# Patient Record
Sex: Female | Born: 1975
Health system: Southern US, Community
[De-identification: ages and names within clinical notes are randomized; demographics above are authoritative.]

## PROBLEM LIST (undated history)

## (undated) DIAGNOSIS — T7840XA Allergy, unspecified, initial encounter: Secondary | ICD-10-CM

## (undated) DIAGNOSIS — Z6791 Unspecified blood type, Rh negative: Secondary | ICD-10-CM

## (undated) DIAGNOSIS — F419 Anxiety disorder, unspecified: Secondary | ICD-10-CM

## (undated) DIAGNOSIS — F32A Depression, unspecified: Secondary | ICD-10-CM

## (undated) DIAGNOSIS — F1910 Other psychoactive substance abuse, uncomplicated: Secondary | ICD-10-CM

## (undated) DIAGNOSIS — Z8619 Personal history of other infectious and parasitic diseases: Secondary | ICD-10-CM

## (undated) DIAGNOSIS — M1990 Unspecified osteoarthritis, unspecified site: Secondary | ICD-10-CM

## (undated) DIAGNOSIS — K311 Adult hypertrophic pyloric stenosis: Secondary | ICD-10-CM

## (undated) DIAGNOSIS — N739 Female pelvic inflammatory disease, unspecified: Secondary | ICD-10-CM

## (undated) HISTORY — DX: Depression, unspecified: F32.A

## (undated) HISTORY — PX: DILATION AND CURETTAGE OF UTERUS: SHX78

## (undated) HISTORY — DX: Anxiety disorder, unspecified: F41.9

## (undated) HISTORY — PX: OTHER SURGICAL HISTORY: SHX169

## (undated) HISTORY — DX: Unspecified blood type, rh negative: Z67.91

## (undated) HISTORY — DX: Allergy, unspecified, initial encounter: T78.40XA

## (undated) HISTORY — PX: HERNIA REPAIR: SHX51

## (undated) HISTORY — DX: Personal history of other infectious and parasitic diseases: Z86.19

## (undated) HISTORY — DX: Unspecified osteoarthritis, unspecified site: M19.90

## (undated) HISTORY — PX: RHINOPLASTY: SUR1284

## (undated) HISTORY — DX: Other psychoactive substance abuse, uncomplicated: F19.10

## (undated) HISTORY — PX: APPENDECTOMY: SHX54

## (undated) HISTORY — DX: Female pelvic inflammatory disease, unspecified: N73.9

---

## 2004-03-26 ENCOUNTER — Ambulatory Visit (HOSPITAL_COMMUNITY): Admission: RE | Admit: 2004-03-26 | Discharge: 2004-03-26 | Payer: Self-pay | Admitting: Family Medicine

## 2004-03-29 ENCOUNTER — Ambulatory Visit (HOSPITAL_COMMUNITY): Admission: RE | Admit: 2004-03-29 | Discharge: 2004-03-29 | Payer: Self-pay | Admitting: Family Medicine

## 2005-02-05 ENCOUNTER — Ambulatory Visit (HOSPITAL_COMMUNITY): Admission: RE | Admit: 2005-02-05 | Discharge: 2005-02-05 | Payer: Self-pay | Admitting: Family Medicine

## 2005-02-05 ENCOUNTER — Ambulatory Visit: Payer: Self-pay | Admitting: *Deleted

## 2005-02-19 ENCOUNTER — Ambulatory Visit: Payer: Self-pay | Admitting: *Deleted

## 2005-02-20 ENCOUNTER — Ambulatory Visit: Payer: Self-pay | Admitting: *Deleted

## 2005-02-20 ENCOUNTER — Inpatient Hospital Stay (HOSPITAL_COMMUNITY): Admission: AD | Admit: 2005-02-20 | Discharge: 2005-02-20 | Payer: Self-pay | Admitting: Obstetrics and Gynecology

## 2005-02-27 ENCOUNTER — Ambulatory Visit: Payer: Self-pay | Admitting: *Deleted

## 2005-03-13 ENCOUNTER — Ambulatory Visit (HOSPITAL_COMMUNITY): Admission: RE | Admit: 2005-03-13 | Discharge: 2005-03-13 | Payer: Self-pay | Admitting: Obstetrics and Gynecology

## 2005-03-13 ENCOUNTER — Ambulatory Visit: Payer: Self-pay | Admitting: Family Medicine

## 2005-03-20 ENCOUNTER — Ambulatory Visit: Payer: Self-pay | Admitting: *Deleted

## 2005-04-03 ENCOUNTER — Ambulatory Visit: Payer: Self-pay | Admitting: *Deleted

## 2005-04-09 ENCOUNTER — Ambulatory Visit: Payer: Self-pay | Admitting: *Deleted

## 2005-04-09 ENCOUNTER — Ambulatory Visit (HOSPITAL_COMMUNITY): Admission: RE | Admit: 2005-04-09 | Discharge: 2005-04-09 | Payer: Self-pay | Admitting: *Deleted

## 2005-04-17 ENCOUNTER — Ambulatory Visit: Payer: Self-pay | Admitting: *Deleted

## 2005-05-01 ENCOUNTER — Ambulatory Visit: Payer: Self-pay | Admitting: *Deleted

## 2005-05-15 ENCOUNTER — Ambulatory Visit: Payer: Self-pay | Admitting: *Deleted

## 2005-06-05 ENCOUNTER — Ambulatory Visit: Payer: Self-pay | Admitting: Family Medicine

## 2005-06-26 ENCOUNTER — Ambulatory Visit: Payer: Self-pay | Admitting: *Deleted

## 2005-07-03 ENCOUNTER — Ambulatory Visit: Payer: Self-pay | Admitting: *Deleted

## 2005-07-10 ENCOUNTER — Ambulatory Visit (HOSPITAL_COMMUNITY): Admission: RE | Admit: 2005-07-10 | Discharge: 2005-07-10 | Payer: Self-pay | Admitting: *Deleted

## 2005-07-10 ENCOUNTER — Ambulatory Visit: Payer: Self-pay | Admitting: Family Medicine

## 2005-07-25 ENCOUNTER — Ambulatory Visit: Payer: Self-pay | Admitting: *Deleted

## 2005-08-06 ENCOUNTER — Inpatient Hospital Stay (HOSPITAL_COMMUNITY): Admission: AD | Admit: 2005-08-06 | Discharge: 2005-08-06 | Payer: Self-pay | Admitting: *Deleted

## 2005-08-07 ENCOUNTER — Ambulatory Visit: Payer: Self-pay | Admitting: *Deleted

## 2005-08-14 ENCOUNTER — Ambulatory Visit: Payer: Self-pay | Admitting: *Deleted

## 2005-08-22 ENCOUNTER — Ambulatory Visit: Payer: Self-pay | Admitting: *Deleted

## 2005-08-28 ENCOUNTER — Ambulatory Visit: Payer: Self-pay | Admitting: *Deleted

## 2005-09-05 ENCOUNTER — Ambulatory Visit: Payer: Self-pay | Admitting: *Deleted

## 2010-11-11 ENCOUNTER — Encounter: Payer: Self-pay | Admitting: *Deleted

## 2011-10-24 ENCOUNTER — Telehealth: Payer: Self-pay | Admitting: *Deleted

## 2011-10-24 NOTE — Telephone Encounter (Signed)
phone has been disconnected home number and work number

## 2012-05-29 ENCOUNTER — Telehealth: Payer: Self-pay | Admitting: Obstetrics and Gynecology

## 2012-06-26 ENCOUNTER — Ambulatory Visit (INDEPENDENT_AMBULATORY_CARE_PROVIDER_SITE_OTHER): Payer: BLUE CROSS/BLUE SHIELD

## 2012-06-26 ENCOUNTER — Other Ambulatory Visit: Payer: Self-pay | Admitting: Obstetrics and Gynecology

## 2012-06-26 ENCOUNTER — Ambulatory Visit (INDEPENDENT_AMBULATORY_CARE_PROVIDER_SITE_OTHER): Payer: BLUE CROSS/BLUE SHIELD | Admitting: Obstetrics and Gynecology

## 2012-06-26 ENCOUNTER — Encounter: Payer: Self-pay | Admitting: Obstetrics and Gynecology

## 2012-06-26 VITALS — BP 122/66 | Temp 98.7°F | Ht 64.0 in | Wt 173.0 lb

## 2012-06-26 DIAGNOSIS — Z6791 Unspecified blood type, Rh negative: Secondary | ICD-10-CM | POA: Insufficient documentation

## 2012-06-26 DIAGNOSIS — Z202 Contact with and (suspected) exposure to infections with a predominantly sexual mode of transmission: Secondary | ICD-10-CM

## 2012-06-26 DIAGNOSIS — T8389XA Other specified complication of genitourinary prosthetic devices, implants and grafts, initial encounter: Secondary | ICD-10-CM

## 2012-06-26 DIAGNOSIS — T8332XA Displacement of intrauterine contraceptive device, initial encounter: Secondary | ICD-10-CM

## 2012-06-26 DIAGNOSIS — N926 Irregular menstruation, unspecified: Secondary | ICD-10-CM

## 2012-06-26 DIAGNOSIS — N739 Female pelvic inflammatory disease, unspecified: Secondary | ICD-10-CM | POA: Insufficient documentation

## 2012-06-26 DIAGNOSIS — Z124 Encounter for screening for malignant neoplasm of cervix: Secondary | ICD-10-CM

## 2012-06-26 DIAGNOSIS — Z9189 Other specified personal risk factors, not elsewhere classified: Secondary | ICD-10-CM

## 2012-06-26 DIAGNOSIS — Z8619 Personal history of other infectious and parasitic diseases: Secondary | ICD-10-CM | POA: Insufficient documentation

## 2012-06-26 MED ORDER — IBUPROFEN 800 MG PO TABS: 800.0000 mg | ORAL_TABLET | Freq: Three times a day (TID) | ORAL | Status: AC | PRN

## 2012-06-26 MED ORDER — DIAZEPAM 10 MG PO TABS
10.0000 mg | ORAL_TABLET | Freq: Four times a day (QID) | ORAL | Status: AC | PRN
Start: 2012-06-26 — End: 2012-07-06

## 2012-06-26 MED ORDER — HYDROCODONE-ACETAMINOPHEN 5-500 MG PO TABS: 1.0000 | ORAL_TABLET | Freq: Four times a day (QID) | ORAL | Status: AC | PRN

## 2012-06-26 MED ORDER — PROMETHAZINE HCL 25 MG PO TABS: 25.0000 mg | ORAL_TABLET | Freq: Four times a day (QID) | ORAL | Status: DC | PRN

## 2012-06-26 NOTE — Progress Notes (Signed)
  The patient reports irregular bleeding and brownish d/c with slight odor..  Contraception:IUD Mirena 11/2006  Last mammogram: patient does not recall results of last mammogram in 2008. Last pap: patient does not recall results of last pap in 2008.  GC/Chlamydia cultures offered: requested HIV/RPR/HbsAg offered:  requested HSV 1 and 2 glycoprotein offered: requested  Menstrual cycle regular and monthly: No: pt has Mirena that should have removed in February of this year. Menstrual flow normal: No: scant spotting since August.  Urinary symptoms: none Normal bowel movements: Yes Reports abuse at home: No  Subjective:    Jacqueline Jimenez is a 36 y.o. female, [id number], who presents for an annual exam. Mirena 11/2006    History   Social History  . Marital Status: Married    Spouse Name: N/A    Number of Children: N/A  . Years of Education: N/A   Social History Main Topics  . Smoking status: Never Smoker   . Smokeless tobacco: Never Used  . Alcohol Use: Yes  . Drug Use: No  . Sexually Active: Yes    Birth Control/ Protection: IUD     mirena   Other Topics Concern  . None   Social History Narrative  . None    Menstrual cycle:   LMP: Patient's last menstrual period was 06/05/2012.           Cycle: monthly, 4 days, very light, no dysmenorrhea. Spotting for last 2 weeks de novo  The following portions of the patient's history were reviewed and updated as appropriate: allergies, current medications, past family history, past medical history, past social history, past surgical history and problem list.  Review of Systems Pertinent items are noted in HPI. Breast:Negative for breast lump,nipple discharge or nipple retraction Gastrointestinal: Negative for abdominal pain, change in bowel habits or rectal bleeding Urinary:negative   Objective:    BP 122/66  Temp 98.7 F (37.1 C) (Oral)  Ht 5\' 4"  (1.626 m)  Wt 173 lb (78.472 kg)  BMI 29.70 kg/m2  LMP 06/05/2012    Weight:    Wt Readings from Last 1 Encounters:  06/26/12 173 lb (78.472 kg)          BMI: Body mass index is 29.70 kg/(m^2).  General Appearance: Alert, appropriate appearance for age. No acute distress HEENT: Grossly normal Neck / Thyroid: Supple, no masses, nodes or enlargement Lungs: clear to auscultation bilaterally Back: No CVA tenderness Breast Exam: No masses or nodes.No dimpling, nipple retraction or discharge. Cardiovascular: Regular rate and rhythm. S1, S2, no murmur Gastrointestinal: Soft, non-tender, no masses or organomegaly Pelvic Exam: Vulva and vagina appear normal. Bimanual exam reveals normal uterus and adnexa. No strings. Rectovaginal: not indicated Lymphatic Exam: Non-palpable nodes in neck, clavicular, axillary, or inguinal regions Skin: no rash or abnormalities Neurologic: Normal gait and speech, no tremor  Psychiatric: Alert and oriented, appropriate affect.   Ultrasound: normal anatomy with IUD well placed  UPT: Negative   Assessment:    Normal gyn exam Contraceptive management : ultrasound today U/S shows IUD in place. Strong family h/o breast cancer   Plan:    mammogram pap smear return annually or prn Offer HSC/removal of IUD/insertion of new IUD: procedure, R&B reviewed  STD screening: declined Contraception:IUD Genetic counseling for breast cancer      Shareena Nusz AMD

## 2012-06-30 ENCOUNTER — Telehealth: Payer: Self-pay

## 2012-06-30 ENCOUNTER — Telehealth: Payer: Self-pay | Admitting: Obstetrics and Gynecology

## 2012-06-30 LAB — PAP IG, CT-NG, RFX HPV ASCU
Chlamydia Probe Amp: NEGATIVE
GC Probe Amp: NEGATIVE

## 2012-06-30 NOTE — Telephone Encounter (Signed)
Valium and Vicodin called in to pharmacy for procedure.  ld

## 2012-06-30 NOTE — Telephone Encounter (Signed)
Attempted to call pt to schedule genetic counseling. Home phone number rang as fax and then busy x3. Work number unable to accept calls.

## 2012-06-30 NOTE — Telephone Encounter (Signed)
Message copied by Mason Jim on Tue Jun 30, 2012  3:26 PM ------      Message from: Silverio Lay      Created: Fri Jun 26, 2012  2:55 PM      Regarding: Genetic counseling       Please call pt to schedule genetic counseling

## 2012-07-03 ENCOUNTER — Telehealth: Payer: Self-pay | Admitting: Obstetrics and Gynecology

## 2012-07-03 NOTE — Telephone Encounter (Signed)
Tc to pt. LM with female to return call.

## 2012-07-06 ENCOUNTER — Telehealth: Payer: Self-pay

## 2012-07-06 NOTE — Telephone Encounter (Signed)
LM with female to call office.  ld

## 2012-07-06 NOTE — Telephone Encounter (Signed)
LM with female to return call.  ld

## 2012-07-06 NOTE — Telephone Encounter (Signed)
Message copied by Larwance Rote on Mon Jul 06, 2012 12:00 PM ------      Message from: Silverio Lay      Created: Thu Jul 02, 2012 10:25 PM      Regarding: labs were not collected       Please call to collect or cancel if pt changed her mind

## 2012-07-06 NOTE — Telephone Encounter (Signed)
Message copied by Larwance Rote on Mon Jul 06, 2012 11:43 AM ------      Message from: Silverio Lay      Created: Thu Jul 02, 2012 10:25 PM      Regarding: labs were not collected       Please call to collect or cancel if pt changed her mind

## 2012-07-08 ENCOUNTER — Other Ambulatory Visit: Payer: Self-pay

## 2012-07-08 ENCOUNTER — Encounter: Payer: Self-pay | Admitting: Obstetrics and Gynecology

## 2012-07-09 ENCOUNTER — Telehealth: Payer: Self-pay | Admitting: Obstetrics and Gynecology

## 2012-07-09 NOTE — Telephone Encounter (Signed)
HSC; Removal of IUD and Insertion of Mirena scheduled for 07/16/12 @ 11:00 with SR.  [employer] effective 10/22/11.  Plan pays 100% after a $15 copay.  Per Shereece A ref # [id number]. Jacqueline Jimenez

## 2012-07-15 ENCOUNTER — Telehealth: Payer: Self-pay | Admitting: Obstetrics and Gynecology

## 2012-07-15 NOTE — Telephone Encounter (Signed)
HSC; Removal of IUD and Insertion of Mirena rescheduled to 08/07/12 @ 9:30 with SR.  [employer] effective 10/22/11.  Plan pays 100% after a $15 copay.  Per Shereece A ref # [id number]. Merita Norton Pridgen

## 2012-07-16 ENCOUNTER — Encounter: Payer: BC Managed Care – PPO | Admitting: Obstetrics and Gynecology

## 2012-07-17 ENCOUNTER — Telehealth: Payer: Self-pay | Admitting: Obstetrics and Gynecology

## 2012-07-17 NOTE — Telephone Encounter (Signed)
HSC; Removal of IUD and Insertion of Mirena rescheduled to 08/04/12 @ 11:20 with SR.  Patient instructed to arrive @ 10:00.  [employer] effective 10/22/11.  Plan pays 100% after a $15 copay. . Per Shereece A ref # [id number]. -ap

## 2012-07-17 NOTE — Telephone Encounter (Signed)
HSC; Removal of IUD and Insertion of Mirena rescheduled to 08/04/12 @ 11:00 with SR.  Patient instructed to arrive @ 10:00.  [employer] effective 10/22/11.  Plan pays 100% after a $15 copay. . Per Shereece A ref # [id number]. Merita Norton Pridgen

## 2012-07-22 NOTE — Telephone Encounter (Signed)
TC to pt. Scheduled for genetic counseling 08/14/12.  Pretest letter and info sent.

## 2012-07-24 ENCOUNTER — Encounter: Payer: BC Managed Care – PPO | Admitting: Obstetrics and Gynecology

## 2012-08-04 ENCOUNTER — Ambulatory Visit (INDEPENDENT_AMBULATORY_CARE_PROVIDER_SITE_OTHER): Payer: BLUE CROSS/BLUE SHIELD | Admitting: Obstetrics and Gynecology

## 2012-08-04 ENCOUNTER — Ambulatory Visit: Payer: BC Managed Care – PPO

## 2012-08-04 VITALS — BP 134/82 | HR 84 | Temp 98.6°F | Resp 12 | Wt 174.0 lb

## 2012-08-04 DIAGNOSIS — N938 Other specified abnormal uterine and vaginal bleeding: Secondary | ICD-10-CM

## 2012-08-04 DIAGNOSIS — Z3009 Encounter for other general counseling and advice on contraception: Secondary | ICD-10-CM

## 2012-08-04 DIAGNOSIS — N949 Unspecified condition associated with female genital organs and menstrual cycle: Secondary | ICD-10-CM

## 2012-08-04 DIAGNOSIS — T8332XA Displacement of intrauterine contraceptive device, initial encounter: Secondary | ICD-10-CM

## 2012-08-04 DIAGNOSIS — T8339XA Other mechanical complication of intrauterine contraceptive device, initial encounter: Secondary | ICD-10-CM

## 2012-08-04 MED ORDER — KETOROLAC TROMETHAMINE 60 MG/2ML IM SOLN
60.0000 mg | Freq: Once | INTRAMUSCULAR | Status: AC
  Administered 2012-08-04: 60 mg via INTRAMUSCULAR

## 2012-08-04 MED ORDER — LEVONORGESTREL 20 MCG/24HR IU IUD
INTRAUTERINE_SYSTEM | Freq: Once | INTRAUTERINE | Status: AC
  Administered 2012-08-04: 1 via INTRAUTERINE

## 2012-08-04 NOTE — Progress Notes (Signed)
DIAGNOSTIC AND/OR OPERATIVE IN-OFFICE HYSTEROSCOPY PROCEDURE    Patient's Name: Jacqueline Jimenez  Date of Birth: 1975-11-06  Date of Procedure: 08/04/2012     Preoperative Diagnosis: DUB with expired stringless IUD  Postoperative Diagnosis: same  Name of Operation/Procedure: Hysteroscopy with removal of IUD and insertion of new IUD  Estimated Hysteroscopic Fluid Deficit: 50  EBL: Minimal   ZOX:WRUEAV prior to procedure   Procedure: The patient was placed in the dorsal lithotomy position.  She was sterilely prepped and draped.  A Grave's speculum was placed in the vaginal vault.  The anterior lip of the cervix was grasped with a single tooth tenaculum.  Using Lidocaine 1% plain; 10mL and Marcaine 0.25% plain; 10mL, a paracervical block was placed in the sacrouterine pillars as we near the right and left parametrial areas.  Approximately 20 ml was used.  A uterine dilator was then used to dilate the internal cervical os.  The uterus was sounded to 8 cm.  The Slimline Hysteroscope was introduced with NS as the distention media.  The findings were normal and thin endometrium with 2 normal tubal ostia.  Sharp curettage was not performed. The IUD was grasped with hysteroscopic forceps and easily removed. The device was retracted and safely removed from the cavity. A new Mirena was easily inserted per protocol.  The instruments were removed.  Sponge and needle counts were correct at the completion of the procedure.  She tolerated the procedure well and was discharged home without complication.  [name] A   Vitals @ 1:40 pm 118/60 76 Pulse 16 Resp  Vitals @ 1:55 pm 188/80 80 Pulse 16 Resp

## 2012-08-07 ENCOUNTER — Encounter: Payer: BC Managed Care – PPO | Admitting: Obstetrics and Gynecology

## 2012-08-14 ENCOUNTER — Ambulatory Visit (INDEPENDENT_AMBULATORY_CARE_PROVIDER_SITE_OTHER): Payer: BLUE CROSS/BLUE SHIELD | Admitting: Obstetrics and Gynecology

## 2012-08-14 ENCOUNTER — Other Ambulatory Visit: Payer: Self-pay | Admitting: Obstetrics and Gynecology

## 2012-08-14 DIAGNOSIS — Z803 Family history of malignant neoplasm of breast: Secondary | ICD-10-CM

## 2012-08-14 NOTE — Progress Notes (Signed)
Genetic counseling for BRCA 1 and 2 completed. Questions answered.  Pt desires testing. Specimen obtained and sent via [employer].  F/U appt scheduled with Dr SR or nurse 09/10/2012.

## 2012-08-19 ENCOUNTER — Other Ambulatory Visit: Payer: Self-pay

## 2012-08-19 DIAGNOSIS — Z803 Family history of malignant neoplasm of breast: Secondary | ICD-10-CM

## 2012-08-21 ENCOUNTER — Encounter: Payer: BC Managed Care – PPO | Admitting: Obstetrics and Gynecology

## 2012-08-26 ENCOUNTER — Encounter: Payer: Self-pay | Admitting: Obstetrics and Gynecology

## 2012-08-26 ENCOUNTER — Ambulatory Visit (INDEPENDENT_AMBULATORY_CARE_PROVIDER_SITE_OTHER): Payer: BLUE CROSS/BLUE SHIELD | Admitting: Obstetrics and Gynecology

## 2012-08-26 VITALS — BP 116/80 | Ht 64.8 in | Wt 170.0 lb

## 2012-08-26 DIAGNOSIS — Z9189 Other specified personal risk factors, not elsewhere classified: Secondary | ICD-10-CM

## 2012-08-26 DIAGNOSIS — Z202 Contact with and (suspected) exposure to infections with a predominantly sexual mode of transmission: Secondary | ICD-10-CM

## 2012-08-26 DIAGNOSIS — Z30431 Encounter for routine checking of intrauterine contraceptive device: Secondary | ICD-10-CM

## 2012-08-26 LAB — HEPATITIS C ANTIBODY: HCV Ab: NEGATIVE

## 2012-08-26 NOTE — Progress Notes (Signed)
Surgery: Hysteroscopy with removal of IUD and insertion of new IUD       Date: 08/04/2012  Eating a regular diet without difficulty. Bowel movements are normal.  The patient is not having any pain.  Bladder function is returned to normal. Vaginal bleeding: spotting Vaginal discharge: no vaginal discharge    Subjective:     Chemeka Filice Mccartin is a 36 y.o. female who presents for post-op visit.   The following portions of the patient's history were reviewed and updated as appropriate: allergies, current medications, past family history, past medical history, past social history, past surgical history and problem list.  Review of Systems Pertinent items are noted in HPI.   Objective:    There were no vitals taken for this visit. Weight:  Wt Readings from Last 1 Encounters:  08/04/12 174 lb (78.926 kg)    BMI: There is no height or weight on file to calculate BMI.  General Appearance: Alert, appropriate appearance for age. No acute distress Pelvic Exam: cervix normal, strings visualized, pelvic exam normal  Assessment:    Doing well postoperatively. Operative findings again reviewed.    Plan:   STD screen per pt Mammogram yearly due to strong family history BRCA testing this month  Silverio Lay MD 11/6/20139:03 AM

## 2012-08-27 LAB — HSV 2 ANTIBODY, IGG: HSV 2 Glycoprotein G Ab, IgG: <0.1 IV

## 2012-08-27 LAB — HSV 1 ANTIBODY, IGG: HSV 1 Glycoprotein G Ab, IgG: <0.1 IV

## 2012-09-04 ENCOUNTER — Telehealth: Payer: Self-pay | Admitting: Obstetrics and Gynecology

## 2012-09-04 NOTE — Telephone Encounter (Signed)
Tc to pt. States has already scheduled mammogram. Reminded of appt 09/10/12 to discuss genetic counseling results.

## 2012-09-07 ENCOUNTER — Other Ambulatory Visit: Payer: Self-pay

## 2012-09-07 DIAGNOSIS — Z1231 Encounter for screening mammogram for malignant neoplasm of breast: Secondary | ICD-10-CM

## 2012-09-07 DIAGNOSIS — Z139 Encounter for screening, unspecified: Secondary | ICD-10-CM

## 2012-09-10 ENCOUNTER — Encounter: Payer: BC Managed Care – PPO | Admitting: Obstetrics and Gynecology

## 2012-09-10 ENCOUNTER — Ambulatory Visit (INDEPENDENT_AMBULATORY_CARE_PROVIDER_SITE_OTHER): Payer: BC Managed Care – PPO | Admitting: Obstetrics and Gynecology

## 2012-09-10 DIAGNOSIS — Z803 Family history of malignant neoplasm of breast: Secondary | ICD-10-CM

## 2012-09-10 NOTE — Progress Notes (Signed)
Pt presented for results of BRCA 1 and BRCA 2.  Informed result negative. Information reviewed regarding increased risk of breast cancer due to family history. Per Dr SR, recommended breast MRI q 2 years.  Pt is agreeable.  Pt is scheduled for mammogram 09/11/12 but will need to R/S. MRI to be scheduled after that mammogram.

## 2012-09-14 ENCOUNTER — Other Ambulatory Visit: Payer: Self-pay | Admitting: Obstetrics and Gynecology

## 2012-09-14 DIAGNOSIS — Z803 Family history of malignant neoplasm of breast: Secondary | ICD-10-CM

## 2012-09-24 ENCOUNTER — Telehealth: Payer: Self-pay | Admitting: Obstetrics and Gynecology

## 2012-09-24 NOTE — Telephone Encounter (Signed)
TC to pt. Per Renee Harder informed cannot get prior auth for breast MRI until mammogram is completed. Pt states she will schedule and call when completed.

## 2012-10-15 ENCOUNTER — Telehealth: Payer: Self-pay | Admitting: Obstetrics and Gynecology

## 2012-10-15 NOTE — Telephone Encounter (Signed)
TC to pt. States mammogram is scheduled Jan 2014. Advised pt to inform me when completed so insurance person can request prior auth for MRI. States received letter from insurance i that MR

## 2012-11-05 ENCOUNTER — Ambulatory Visit: Payer: BC Managed Care – PPO

## 2012-11-09 ENCOUNTER — Ambulatory Visit
Admission: RE | Admit: 2012-11-09 | Discharge: 2012-11-09 | Disposition: A | Discharge status: Home / Self Care | Payer: BC Managed Care – PPO | Source: Ambulatory Visit | Attending: Obstetrics and Gynecology | Admitting: Obstetrics and Gynecology

## 2012-11-09 DIAGNOSIS — Z1231 Encounter for screening mammogram for malignant neoplasm of breast: Secondary | ICD-10-CM

## 2012-11-11 ENCOUNTER — Other Ambulatory Visit: Payer: Self-pay | Admitting: Obstetrics and Gynecology

## 2012-11-11 DIAGNOSIS — R928 Other abnormal and inconclusive findings on diagnostic imaging of breast: Secondary | ICD-10-CM

## 2012-11-12 NOTE — Progress Notes (Signed)
Quick Note:  The recent mammogram is incomplete / abnormal suggesting a close follow-up / additional images. When is the next appointment to follow-up on this mammogram?  Please document in chart. ______ 

## 2012-11-24 ENCOUNTER — Other Ambulatory Visit: Payer: BC Managed Care – PPO

## 2012-12-08 ENCOUNTER — Ambulatory Visit
Admission: RE | Admit: 2012-12-08 | Discharge: 2012-12-08 | Disposition: A | Discharge status: Home / Self Care | Payer: BC Managed Care – PPO | Source: Ambulatory Visit | Attending: Obstetrics and Gynecology | Admitting: Obstetrics and Gynecology

## 2012-12-08 DIAGNOSIS — R928 Other abnormal and inconclusive findings on diagnostic imaging of breast: Secondary | ICD-10-CM

## 2012-12-10 NOTE — Progress Notes (Signed)
Quick Note:  The recent mammogram is incomplete / abnormal suggesting a close follow-up / additional images. When is the next appointment to follow-up on this mammogram?  Please document in chart. ______ 

## 2012-12-11 ENCOUNTER — Other Ambulatory Visit: Payer: Self-pay | Admitting: Obstetrics and Gynecology

## 2012-12-11 DIAGNOSIS — N6489 Other specified disorders of breast: Secondary | ICD-10-CM

## 2014-08-22 ENCOUNTER — Encounter: Payer: Self-pay | Admitting: Obstetrics and Gynecology

## 2015-04-24 ENCOUNTER — Ambulatory Visit (INDEPENDENT_AMBULATORY_CARE_PROVIDER_SITE_OTHER): Payer: BLUE CROSS/BLUE SHIELD | Admitting: Family Medicine

## 2015-04-24 VITALS — BP 120/88 | HR 82 | Temp 99.1°F | Resp 18 | Ht 65.0 in | Wt 157.2 lb

## 2015-04-24 DIAGNOSIS — T798XXA Other early complications of trauma, initial encounter: Secondary | ICD-10-CM

## 2015-04-24 DIAGNOSIS — R350 Frequency of micturition: Secondary | ICD-10-CM

## 2015-04-24 LAB — POCT UA - MICROSCOPIC ONLY
CASTS, UR, LPF, POC: NEGATIVE
Crystals, Ur, HPF, POC: NEGATIVE
Yeast, UA: NEGATIVE

## 2015-04-24 LAB — POCT URINALYSIS DIPSTICK
Glucose, UA: NEGATIVE
Nitrite, UA: POSITIVE
Protein, UA: 100
Spec Grav, UA: >=1.03
Urobilinogen, UA: 1
pH, UA: 6

## 2015-04-24 MED ORDER — DOXYCYCLINE HYCLATE 100 MG PO CAPS: 100.0000 mg | ORAL_CAPSULE | Freq: Two times a day (BID) | ORAL | Status: DC

## 2015-04-24 MED ORDER — PHENAZOPYRIDINE HCL 200 MG PO TABS: 200.0000 mg | ORAL_TABLET | Freq: Three times a day (TID) | ORAL | Status: DC | PRN

## 2015-04-24 NOTE — Patient Instructions (Signed)

## 2015-04-24 NOTE — Progress Notes (Signed)
Subjective:  This chart was scribed for Reginia Forts, MD by Thea Alken, ED Scribe. This patient was seen in room 5 and the patient's care was started at 4:20 PM.   Patient ID: Jacqueline Jimenez, female    DOB: 05-07-1976, 39 y.o.   MRN: [id number]  HPI Chief Complaint  Patient presents with  . Urinary Tract Infection    urinary frequency x1 day  . Infected Belly Button Right  . pRESCRIPTIONS    wANTS ALL PRESCRIPTIONS PRINTED   HPI Comments: Jacqueline Jimenez is a 39 y.o. female who presents to the [facility] complaining of urinary urgency that began yesterday. Pt reports associated urinary frequency, straining with urination and bladder fullness. She's had 1 UTI in the past. Pt is married and denies new sexual partners. She uses mirena for birth control She denies fevers, chills, sweats, hematuria, vaginal discharge, nausea, emesis, and back pan. Pt is leaving for [place] in 6 days for vacation. She is followed by [facility].  Pt also complains of a possible naval piercing infection. Pt states she's had her naval pierced for about 20 years. She lost a piece of her ring and after having it replaced she began to have some discomfort and irritation. Pt has been cleaning area with peroxide and abx ointment.  Past Medical History  Diagnosis Date  . PID (pelvic inflammatory disease)   . Blood type, Rh negative   . History of chicken pox    Past Surgical History  Procedure Laterality Date  . Cesarean section    . Dilation and curettage of uterus    . Rhinoplasty    . Calf augmentation    . Club foot correction    . Appendectomy     Prior to Admission medications   Medication Sig Start Date End Date Taking? Authorizing Provider  amphetamine-dextroamphetamine (ADDERALL) 20 MG tablet Take 20 mg by mouth daily.   Yes Historical Provider, MD  ergocalciferol (DRISDOL) 50000 UNITS capsule Take 50,000 Units by mouth once a week.   Yes Historical Provider, MD    fluticasone (FLONASE) 50 MCG/ACT nasal spray Place 1 spray into both nostrils daily.   Yes Historical Provider, MD  levocetirizine (XYZAL) 5 MG tablet Take 5 mg by mouth every evening.   Yes Historical Provider, MD  montelukast (SINGULAIR) 10 MG tablet Take 10 mg by mouth at bedtime.   Yes Historical Provider, MD  promethazine (PHENERGAN) 25 MG tablet Take 1 tablet (25 mg total) by mouth every 6 (six) hours as needed for nausea. 06/26/12 07/03/12  Delsa Bern, MD   History   Social History  . Marital Status: Married    Spouse Name: N/A  . Number of Children: N/A  . Years of Education: N/A   Occupational History  . Not on file.   Social History Main Topics  . Smoking status: Never Smoker   . Smokeless tobacco: Never Used  . Alcohol Use: Yes  . Drug Use: No  . Sexual Activity: Yes    Birth Control/ Protection: IUD     Comment: mirena   Other Topics Concern  . Not on file   Social History Narrative    Review of Systems  Constitutional: Negative for fever, chills, diaphoresis and fatigue.  Gastrointestinal: Negative for nausea, vomiting and diarrhea.  Genitourinary: Positive for urgency and frequency. Negative for dysuria, hematuria, flank pain, vaginal bleeding, vaginal discharge, genital sores, vaginal pain and pelvic pain.  Musculoskeletal: Negative for myalgias and  back pain.   Objective:   Physical Exam  Constitutional: She is oriented to person, place, and time. She appears well-developed and well-nourished. No distress.  HENT:  Head: Normocephalic and atraumatic.  Eyes: Conjunctivae and EOM are normal.  Neck: Neck supple.  Cardiovascular: Normal rate, regular rhythm and normal heart sounds.   No murmur heard. Pulmonary/Chest: Effort normal and breath sounds normal. No respiratory distress. She has no wheezes. She has no rales.  Abdominal: Soft. Bowel sounds are normal. She exhibits no distension and no mass. There is no tenderness. There is no rebound, no guarding  and no CVA tenderness.  Musculoskeletal: Normal range of motion.  Neurological: She is alert and oriented to person, place, and time.  Skin: Skin is warm and dry. There is erythema.  Piercing at umbilicus with mild erythema and induration; no active drainage; mildly TTP.  Psychiatric: She has a normal mood and affect. Her behavior is normal.  Nursing note and vitals reviewed.   Results for orders placed or performed in visit on 04/24/15  POCT UA - Microscopic Only  Result Value Ref Range   WBC, Ur, HPF, POC 35-40    RBC, urine, microscopic 5-10    Bacteria, U Microscopic 3+    Mucus, UA 1+    Epithelial cells, urine per micros 2-5    Crystals, Ur, HPF, POC neg    Casts, Ur, LPF, POC neg    Yeast, UA neg   POCT urinalysis dipstick  Result Value Ref Range   Color, UA amber    Clarity, UA cloudy    Glucose, UA neg    Bilirubin, UA small    Ketones, UA trace    Spec Grav, UA >=1.030    Blood, UA large    pH, UA 6.0    Protein, UA 100    Urobilinogen, UA 1.0    Nitrite, UA pos    Leukocytes, UA moderate (2+) (A) Negative   Assessment & Plan:   1. Urinary frequency   2. Infected piercing of trunk, initial encounter    1. Urinary frequency: New. Send urine culture; treat empirically with Doxycycline and Pyridium. 2.  Infected piercing of trunk: New. Mild. Doxycycline.  Continue with local wound care.   Meds ordered this encounter  Medications  . amphetamine-dextroamphetamine (ADDERALL) 20 MG tablet    Sig: Take 20 mg by mouth daily.  . fluticasone (FLONASE) 50 MCG/ACT nasal spray    Sig: Place 1 spray into both nostrils daily.  . montelukast (SINGULAIR) 10 MG tablet    Sig: Take 10 mg by mouth at bedtime.  Marland Kitchen levocetirizine (XYZAL) 5 MG tablet    Sig: Take 5 mg by mouth every evening.  Marland Kitchen DISCONTD: doxycycline (VIBRAMYCIN) 100 MG capsule    Sig: Take 1 capsule (100 mg total) by mouth 2 (two) times daily.    Dispense:  20 capsule    Refill:  0  . doxycycline  (VIBRAMYCIN) 100 MG capsule    Sig: Take 1 capsule (100 mg total) by mouth 2 (two) times daily.    Dispense:  20 capsule    Refill:  0  . phenazopyridine (PYRIDIUM) 200 MG tablet    Sig: Take 1 tablet (200 mg total) by mouth 3 (three) times daily as needed for pain.    Dispense:  6 tablet    Refill:  0   I personally performed the services described in this documentation, which was scribed in my presence. The recorded information has been reviewed  and considered.  Samuell Knoble Elayne Guerin, M.D. [facility] [address] [place], [place]  [address] [phone] phone [phone] fax

## 2015-04-25 ENCOUNTER — Telehealth: Payer: Self-pay

## 2015-04-25 ENCOUNTER — Other Ambulatory Visit: Payer: Self-pay | Admitting: Physician Assistant

## 2015-04-25 DIAGNOSIS — R110 Nausea: Secondary | ICD-10-CM

## 2015-04-25 MED ORDER — ONDANSETRON 8 MG PO TBDP: 8.0000 mg | ORAL_TABLET | Freq: Three times a day (TID) | ORAL | Status: DC | PRN

## 2015-04-25 NOTE — Progress Notes (Signed)
Patient concerned of nausea with taking the doxycycline.  She states that it is following the doxycycline only and not the pyridium.  It occurs 45 minutes after administration of doxycycline only.   She is concerned due to her emetophobia.  She has eaten very lwhen she takes the medication.  She asked to take a half dose.   Plan: Continue the doxycycline with the addition of zofran for nausea.  I have advised her to eat a normal meal prior to taking the medication, and with water--avoiding dairy.   She will contact if her nausea continues. May consider changing to bactrim.

## 2015-04-25 NOTE — Telephone Encounter (Addendum)
Pt states she had a side effect from the medicine given yesterday and wanted to know if the Dr could give her a lower dosage Please call [facility]

## 2015-04-26 LAB — URINE CULTURE: Colony Count: >=100000

## 2015-04-26 NOTE — Telephone Encounter (Signed)
doxycycline (VIBRAMYCIN) 100 MG capsule 20 capsule 0 04/24/2015      Take 1 capsule (100 mg total) by mouth 2 (two) times daily. - Oral    phenazopyridine (PYRIDIUM) 200 MG tablet 6 tablet 0 04/24/2015     Take 1 tablet (200 mg total) by mouth 3 (three) times daily as needed for pain. - Oral      Called pt, unable o leave voicemail.

## 2015-04-27 NOTE — Telephone Encounter (Signed)
Spoke with pt, she was Rx'd Zofran for nausea.

## 2015-05-16 ENCOUNTER — Ambulatory Visit (INDEPENDENT_AMBULATORY_CARE_PROVIDER_SITE_OTHER): Payer: BLUE CROSS/BLUE SHIELD | Admitting: Internal Medicine

## 2015-05-16 VITALS — BP 120/78 | HR 91 | Temp 98.3°F | Resp 18 | Ht 64.0 in | Wt 154.0 lb

## 2015-05-16 DIAGNOSIS — R21 Rash and other nonspecific skin eruption: Secondary | ICD-10-CM | POA: Diagnosis not present

## 2015-05-16 DIAGNOSIS — J029 Acute pharyngitis, unspecified: Secondary | ICD-10-CM | POA: Diagnosis not present

## 2015-05-16 DIAGNOSIS — R059 Cough, unspecified: Secondary | ICD-10-CM

## 2015-05-16 DIAGNOSIS — R300 Dysuria: Secondary | ICD-10-CM | POA: Diagnosis not present

## 2015-05-16 DIAGNOSIS — R05 Cough: Secondary | ICD-10-CM | POA: Diagnosis not present

## 2015-05-16 LAB — POCT URINALYSIS DIPSTICK
Bilirubin, UA: NEGATIVE
Blood, UA: NEGATIVE
GLUCOSE UA: NEGATIVE
KETONES UA: NEGATIVE
LEUKOCYTES UA: NEGATIVE
Nitrite, UA: NEGATIVE
Protein, UA: NEGATIVE
UROBILINOGEN UA: 1
pH, UA: 7

## 2015-05-16 LAB — POCT UA - MICROSCOPIC ONLY
CASTS, UR, LPF, POC: NEGATIVE
CRYSTALS, UR, HPF, POC: NEGATIVE
YEAST UA: NEGATIVE

## 2015-05-16 MED ORDER — TRIAMCINOLONE ACETONIDE 0.1 % EX CREA: 1.0000 "application " | TOPICAL_CREAM | Freq: Two times a day (BID) | CUTANEOUS | Status: DC

## 2015-05-16 MED ORDER — CEPHALEXIN 250 MG/5ML PO SUSR: 500.0000 mg | Freq: Three times a day (TID) | ORAL | Status: DC

## 2015-05-16 NOTE — Progress Notes (Signed)
Subjective:  This chart was scribed for Tami Lin, MD by Leandra Kern, Medical Scribe. This patient was seen in Room 5 and the patient's care was started at 6:33 PM.   Patient ID: Jacqueline Jimenez, female    DOB: 1976/02/01, 39 y.o.   MRN: [id number]  Chief Complaint  Patient presents with  . Follow-up    UTI syptoms worsten  . Sore Throat    x 3days  . Cough    x 3 days  . body itch    x 3 days    HPI HPI Comments: Jacqueline Jimenez is a 39 y.o. female who presents to [facility] for a follow up on UTI symptoms.  Pt was seen here by Dr. Tamala Julian on 04/24/2015 for urinary frequency for which she was prescribed Doxycycline and Pyridium. Pt notes that she is still presented with urinary urgency and frequency. She reports that her symptoms are less severs. Pt expresses that she would like to get a medication that is in a liquid form because she has difficulty swallowing pills.   Pt reports that her skin itches in the sun exposed areas. She indicates that the symptoms started when she was in [place] more than a week ago where the weather is about a 100 degrees. Pt notes that she was outdoors frequently and using the doxycycline when the symptoms started. She did use sunscreen often. She states that the area is not itching currently, however her itching episodes do disturb her sleep.   Pt states that she has sore throat and a productive cough for about 3 days now. Pt notes that the symptoms do not keep her up at night. She states that the symptoms are worse in the morning time. She denies having a fever. Pt indicates that she occasionally uses Afrin for stuffy nose that she usually has in the morning.   Pt notes that she still present with erythema to the piercing site to her umbilicus. Pt has had a piercing for 20 years, and she was presented with the symptoms about a month and a half ago. Pt reports that she also experiences itching to the area. She states that the  Doxycycline did improve her symptoms. No longer go up he's of the piercing became embedded in it had to be removed and was replaced with a new piece to hold it in place. The inflammation may have started after this.    Patient Active Problem List   Diagnosis Date Noted  . PID (pelvic inflammatory disease)   . Blood type, Rh negative   . History of chicken pox    Past Medical History  Diagnosis Date  . PID (pelvic inflammatory disease)   . Blood type, Rh negative   . History of chicken pox    Past Surgical History  Procedure Laterality Date  . Cesarean section    . Dilation and curettage of uterus    . Rhinoplasty    . Calf augmentation    . Club foot correction    . Appendectomy     No Known Allergies Prior to Admission medications   Medication Sig Start Date End Date Taking? Authorizing Provider  amphetamine-dextroamphetamine (ADDERALL) 20 MG tablet Take 20 mg by mouth daily.   Yes Historical Provider, MD  fluticasone (FLONASE) 50 MCG/ACT nasal spray Place 1 spray into both nostrils daily.   Yes Historical Provider, MD  levocetirizine (XYZAL) 5 MG tablet Take 5 mg by mouth every evening.  Yes Historical Provider, MD  montelukast (SINGULAIR) 10 MG tablet Take 10 mg by mouth at bedtime.   Yes Historical Provider, MD  doxycycline (VIBRAMYCIN) 100 MG capsule Take 1 capsule (100 mg total) by mouth 2 (two) times daily. Patient not taking: Reported on 05/16/2015 04/24/15   Wardell Honour, MD  ergocalciferol (DRISDOL) 50000 UNITS capsule Take 50,000 Units by mouth once a week.    Historical Provider, MD  ondansetron (ZOFRAN-ODT) 8 MG disintegrating tablet Take 1 tablet (8 mg total) by mouth every 8 (eight) hours as needed for nausea. Patient not taking: Reported on 05/16/2015 04/25/15   Dorian Heckle English, PA  phenazopyridine (PYRIDIUM) 200 MG tablet Take 1 tablet (200 mg total) by mouth 3 (three) times daily as needed for pain. Patient not taking: Reported on 05/16/2015 04/24/15   Wardell Honour, MD  promethazine (PHENERGAN) 25 MG tablet Take 1 tablet (25 mg total) by mouth every 6 (six) hours as needed for nausea. 06/26/12 07/03/12  Delsa Bern, MD   History   Social History  . Marital Status: Married    Spouse Name: N/A  . Number of Children: N/A  . Years of Education: N/A   Occupational History  . Not on file.   Social History Main Topics  . Smoking status: Never Smoker   . Smokeless tobacco: Never Used  . Alcohol Use: Yes  . Drug Use: No  . Sexual Activity: Yes    Birth Control/ Protection: IUD     Comment: mirena   Other Topics Concern  . Not on file   Social History Narrative    Review of Systems  Constitutional: Negative for fever.  HENT: Positive for sore throat.   Respiratory: Positive for cough.   Genitourinary: Positive for urgency and frequency.  Skin: Positive for color change and wound.  Psychiatric/Behavioral: Positive for sleep disturbance.      Objective:   Physical Exam  Constitutional: She is oriented to person, place, and time. She appears well-developed and well-nourished. No distress.  HENT:  Head: Normocephalic and atraumatic.  Right Ear: External ear normal.  Left Ear: External ear normal.  Nose: Nose normal.  The pharynx is red with scant exudate  Eyes: Conjunctivae and EOM are normal. Pupils are equal, round, and reactive to light.  Neck: Neck supple.  Small cervical nodes not tender  Cardiovascular: Normal rate, regular rhythm and normal heart sounds.   Pulmonary/Chest: Effort normal and breath sounds normal. She has no wheezes. She has no rales.  Neurological: She is alert and oriented to person, place, and time. No cranial nerve deficit.  Skin: Skin is warm and dry.  She has diffuse hyperemia in all sun exposed areas with a T-shirt and shorts distribution. There are no petechiae, urticarial lesions, ecchymoses, vesicles, and no papular component.  Navel piercing continues to have erythema but no pus and no scar  formation and no granuloma  Psychiatric: She has a normal mood and affect. Her behavior is normal.  Nursing note and vitals reviewed.  BP 120/78 mmHg  Pulse 91  Temp(Src) 98.3 F (36.8 C) (Oral)  Resp 18  Ht 5\' 4"  (1.626 m)  Wt 154 lb (69.854 kg)  BMI 26.42 kg/m2  SpO2 99%  LMP 04/18/2015    Results for orders placed or performed in visit on 05/16/15  POCT UA - Microscopic Only  Result Value Ref Range   WBC, Ur, HPF, POC 1-5    RBC, urine, microscopic 0-3    Bacteria, U  Microscopic 1+    Mucus, UA Moderate    Epithelial cells, urine per micros 1-5    Crystals, Ur, HPF, POC negative    Casts, Ur, LPF, POC negative    Yeast, UA negative   POCT urinalysis dipstick  Result Value Ref Range   Color, UA yellow    Clarity, UA clear    Glucose, UA negative    Bilirubin, UA negative    Ketones, UA negative    Spec Grav, UA >=1.030    Blood, UA negative    pH, UA 7.0    Protein, UA negative    Urobilinogen, UA 1.0    Nitrite, UA negative    Leukocytes, UA Negative Negative    Assessment & Plan:  I have completed the patient encounter in its entirety as documented by the scribe, with editing by me where necessary. Aphrodite Harpenau P. Laney Pastor, M.D.  Dysuria -recent use 3 times a day with possibility of under treatment from prescribed medicine  Because of continued minor symptoms this will be covered with Keflex and will follow-up if she doesn't respond  Rash and nonspecific skin eruption--- this is a solar dermatitis/photosensitivity reaction secondary to doxycycline  To use moisturizer and this will resolve  Cough---Sore throat --very likely related to viral illness//will use throat culture since she'll be on Keflex anyway  Umbilicus piercing irritation--the piece it replaced maybe nickel///for now to use hydrogen peroxide plus soap and apply steroid cream   Meds ordered this encounter  Medications  . triamcinolone cream (KENALOG) 0.1 %    Sig: Apply 1 application topically 2  (two) times daily.    Dispense:  15 g    Refill:  0  . cephALEXin (KEFLEX) 250 MG/5ML suspension    Sig: Take 10 mLs (500 mg total) by mouth 3 (three) times daily.    Dispense:  300 mL    Refill:  0   doesn't swallow pills well

## 2015-05-18 LAB — CULTURE, GROUP A STREP

## 2015-05-19 ENCOUNTER — Telehealth: Payer: Self-pay

## 2015-05-19 NOTE — Telephone Encounter (Signed)
Patient is returning a missed phone call from lab. Please call! [phone]

## 2015-05-20 NOTE — Telephone Encounter (Signed)
Spoke with patient gave her results 

## 2015-06-09 ENCOUNTER — Other Ambulatory Visit: Payer: Self-pay

## 2015-06-09 MED ORDER — FLUCONAZOLE 150 MG PO TABS: 150.0000 mg | ORAL_TABLET | Freq: Once | ORAL | Status: DC

## 2015-06-10 ENCOUNTER — Other Ambulatory Visit: Payer: Self-pay | Admitting: Physician Assistant

## 2015-06-22 DIAGNOSIS — N3010 Interstitial cystitis (chronic) without hematuria: Secondary | ICD-10-CM | POA: Insufficient documentation

## 2015-07-08 ENCOUNTER — Emergency Department (HOSPITAL_COMMUNITY)
Admission: EM | Admit: 2015-07-08 | Discharge: 2015-07-08 | Disposition: A | Discharge status: Home / Self Care | Payer: BLUE CROSS/BLUE SHIELD | Source: Home / Self Care | Attending: Family Medicine | Admitting: Family Medicine

## 2015-07-08 ENCOUNTER — Encounter (HOSPITAL_COMMUNITY): Payer: Self-pay | Admitting: Emergency Medicine

## 2015-07-08 ENCOUNTER — Emergency Department (INDEPENDENT_AMBULATORY_CARE_PROVIDER_SITE_OTHER): Payer: BLUE CROSS/BLUE SHIELD

## 2015-07-08 DIAGNOSIS — B349 Viral infection, unspecified: Secondary | ICD-10-CM | POA: Diagnosis not present

## 2015-07-08 DIAGNOSIS — R6889 Other general symptoms and signs: Secondary | ICD-10-CM

## 2015-07-08 DIAGNOSIS — J069 Acute upper respiratory infection, unspecified: Secondary | ICD-10-CM | POA: Diagnosis not present

## 2015-07-08 MED ORDER — OSELTAMIVIR PHOSPHATE 75 MG PO CAPS: 75.0000 mg | ORAL_CAPSULE | Freq: Two times a day (BID) | ORAL | Status: DC

## 2015-07-08 NOTE — Discharge Instructions (Signed)
Influenza °Influenza ("the flu") is a viral infection of the respiratory tract. It occurs more often in winter months because people spend more time in close contact with one another. Influenza can make you feel very sick. Influenza easily spreads from person to person (contagious). °CAUSES  °Influenza is caused by a virus that infects the respiratory tract. You can catch the virus by breathing in droplets from an infected person's cough or sneeze. You can also catch the virus by touching something that was recently contaminated with the virus and then touching your mouth, nose, or eyes. °RISKS AND COMPLICATIONS °You may be at risk for a more severe case of influenza if you smoke cigarettes, have diabetes, have chronic heart disease (such as heart failure) or lung disease (such as asthma), or if you have a weakened immune system. Elderly people and pregnant women are also at risk for more serious infections. The most common problem of influenza is a lung infection (pneumonia). Sometimes, this problem can require emergency medical care and may be life threatening. °SIGNS AND SYMPTOMS  °Symptoms typically last 4 to 10 days and may include: °· Fever. °· Chills. °· Headache, body aches, and muscle aches. °· Sore throat. °· Chest discomfort and cough. °· Poor appetite. °· Weakness or feeling tired. °· Dizziness. °· Nausea or vomiting. °DIAGNOSIS  °Diagnosis of influenza is often made based on your history and a physical exam. A nose or throat swab test can be done to confirm the diagnosis. °TREATMENT  °In mild cases, influenza goes away on its own. Treatment is directed at relieving symptoms. For more severe cases, your health care provider may prescribe antiviral medicines to shorten the sickness. Antibiotic medicines are not effective because the infection is caused by a virus, not by bacteria. °HOME CARE INSTRUCTIONS °· Take medicines only as directed by your health care provider. °· Use a cool mist humidifier to make  breathing easier. °· Get plenty of rest until your temperature returns to normal. This usually takes 3 to 4 days. °· Drink enough fluid to keep your urine clear or pale yellow. °· Cover your mouth and nose when coughing or sneezing, and wash your hands well to prevent the virus from spreading. °· Stay home from work or school until the fever is gone for at least 1 full day. °PREVENTION  °An annual influenza vaccination (flu shot) is the best way to avoid getting influenza. An annual flu shot is now routinely recommended for all adults in the [place] °SEEK MEDICAL CARE IF: °· You experience chest pain, your cough worsens, or you produce more mucus. °· You have nausea, vomiting, or diarrhea. °· Your fever returns or gets worse. °SEEK IMMEDIATE MEDICAL CARE IF: °· You have trouble breathing, you become short of breath, or your skin or nails become bluish. °· You have severe pain or stiffness in the neck. °· You develop a sudden headache, or pain in the face or ear. °· You have nausea or vomiting that you cannot control. °MAKE SURE YOU:  °· Understand these instructions. °· Will watch your condition. °· Will get help right away if you are not doing well or get worse. °Document Released: 10/04/2000 Document Revised: 02/21/2014 Document Reviewed: 01/06/2012 °Exit[employer]® Patient Information ©2015 [employer]. This information is not intended to replace advice given to you by your health care provider. Make sure you discuss any questions you have with your health care provider. ° °Viral Infections °A viral infection can be caused by different types of viruses. Most viral infections are not serious and resolve on their own. However, some infections may cause severe symptoms and may lead to further complications. °SYMPTOMS °Viruses can frequently   cause: °· Minor sore throat. °· Aches and pains. °· Headaches. °· Runny nose. °· Different types of rashes. °· Watery eyes. °· Tiredness. °· Cough. °· Loss of  appetite. °· Gastrointestinal infections, resulting in nausea, vomiting, and diarrhea. °These symptoms do not respond to antibiotics because the infection is not caused by bacteria. However, you might catch a bacterial infection following the viral infection. This is sometimes called a "superinfection." Symptoms of such a bacterial infection may include: °· Worsening sore throat with pus and difficulty swallowing. °· Swollen neck glands. °· Chills and a high or persistent fever. °· Severe headache. °· Tenderness over the sinuses. °· Persistent overall ill feeling (malaise), muscle aches, and tiredness (fatigue). °· Persistent cough. °· Yellow, green, or brown mucus production with coughing. °HOME CARE INSTRUCTIONS  °· Only take over-the-counter or prescription medicines for pain, discomfort, diarrhea, or fever as directed by your caregiver. °· Drink enough water and fluids to keep your urine clear or pale yellow. Sports drinks can provide valuable electrolytes, sugars, and hydration. °· Get plenty of rest and maintain proper nutrition. Soups and broths with crackers or rice are fine. °SEEK IMMEDIATE MEDICAL CARE IF:  °· You have severe headaches, shortness of breath, chest pain, neck pain, or an unusual rash. °· You have uncontrolled vomiting, diarrhea, or you are unable to keep down fluids. °· You or your child has an oral temperature above 102° F (38.9° C), not controlled by medicine. °· Your baby is older than 3 months with a rectal temperature of 102° F (38.9° C) or higher. °· Your baby is 3 months old or younger with a rectal temperature of 100.4° F (38° C) or higher. °MAKE SURE YOU:  °· Understand these instructions. °· Will watch your condition. °· Will get help right away if you are not doing well or get worse. °Document Released: 07/17/2005 Document Revised: 12/30/2011 Document Reviewed: 02/11/2011 °ExitCare® Patient Information ©2015 [employer]. This information is not intended to replace advice given  to you by your health care provider. Make sure you discuss any questions you have with your health care provider. ° °

## 2015-07-08 NOTE — ED Provider Notes (Signed)
CSN: [id number]     Arrival date & time 07/08/15  1945 History   First MD Initiated Contact with Patient 07/08/15 2046     Chief Complaint  Patient presents with  . Fever  . Cough  . Headache   (Consider location/radiation/quality/duration/timing/severity/associated sxs/prior Treatment) HPI Comments: 39 year old female complaining of generalized aches and pains for 24 hours. She is also complaining of PND, headache, body aches, mild shortness of breath. Denies chest pain. She states her fever went up to 103 last PM. Is currently 99.4.   Past Medical History  Diagnosis Date  . PID (pelvic inflammatory disease)   . Blood type, Rh negative   . History of chicken pox    Past Surgical History  Procedure Laterality Date  . Cesarean section    . Dilation and curettage of uterus    . Rhinoplasty    . Calf augmentation    . Club foot correction    . Appendectomy     Family History  Problem Relation Age of Onset  . Diabetes Maternal Grandmother   . Cancer Maternal Grandmother   . Breast cancer Maternal Grandmother 51  . Hypertension Father   . Cancer Mother   . Breast cancer Mother 81    survivor  . Breast cancer Other     unknown age and pased away   Social History  Substance Use Topics  . Smoking status: Never Smoker   . Smokeless tobacco: Never Used  . Alcohol Use: Yes   OB History    Gravida Para Term Preterm AB TAB SAB Ectopic Multiple Living   4 1 0 0 3 1 2 0 0 1      Review of Systems  Constitutional: Positive for fever, chills and activity change.  HENT: Positive for congestion, postnasal drip, rhinorrhea and voice change. Negative for facial swelling and sore throat.   Eyes: Negative.   Respiratory: Positive for cough. Negative for chest tightness.   Cardiovascular: Negative.   Gastrointestinal: Negative.   Genitourinary: Negative.   Musculoskeletal: Negative for neck pain and neck stiffness.  Skin: Negative for pallor and rash.  Neurological: Negative.      Allergies  Review of patient's allergies indicates no known allergies.  Home Medications   Prior to Admission medications   Medication Sig Start Date End Date Taking? Authorizing Ashaz Robling  levocetirizine (XYZAL) 5 MG tablet Take 5 mg by mouth every evening.   Yes Historical Dante Cooter, MD  montelukast (SINGULAIR) 10 MG tablet Take 10 mg by mouth at bedtime.   Yes Historical Kamauri Denardo, MD  amphetamine-dextroamphetamine (ADDERALL) 20 MG tablet Take 20 mg by mouth daily.    Historical James Senn, MD  cephALEXin (KEFLEX) 250 MG/5ML suspension Take 10 mLs (500 mg total) by mouth 3 (three) times daily. 05/16/15   Leandrew Koyanagi, MD  ergocalciferol (DRISDOL) 50000 UNITS capsule Take 50,000 Units by mouth once a week.    Historical Klair Leising, MD  fluconazole (DIFLUCAN) 150 MG tablet Take 1 tablet (150 mg total) by mouth once. May repeat in 1 week if needed. 06/09/15   Leandrew Koyanagi, MD  fluticasone (FLONASE) 50 MCG/ACT nasal spray Place 1 spray into both nostrils daily.    Historical Carmel Waddington, MD  oseltamivir (TAMIFLU) 75 MG capsule Take 1 capsule (75 mg total) by mouth 2 (two) times daily. 07/08/15   Janne Napoleon, NP  promethazine (PHENERGAN) 25 MG tablet Take 1 tablet (25 mg total) by mouth every 6 (six) hours as needed for nausea. 06/26/12 07/03/12  Katharine Look  Rivard, MD  triamcinolone cream (KENALOG) 0.1 % Apply 1 application topically 2 (two) times daily. 05/16/15   Leandrew Koyanagi, MD   Meds Ordered and Administered this Visit  Medications - No data to display  BP 130/79 mmHg  Pulse 78  Temp(Src) 99.4 F (37.4 C) (Oral)  Resp 18  SpO2 95%  LMP 06/22/2015 (Approximate) No data found.   Physical Exam  Constitutional: She is oriented to person, place, and time. She appears well-developed and well-nourished. No distress.  HENT:  Unable to visualize the oropharynx due to patient's retraction of the tongue.  Eyes: Conjunctivae and EOM are normal.  Neck: Normal range of motion. Neck supple.   Cardiovascular: Normal rate, regular rhythm and normal heart sounds.   Pulmonary/Chest: Effort normal.  Distant faint crackles in the left and right base.  Musculoskeletal: She exhibits no edema.  Lymphadenopathy:    She has no cervical adenopathy.  Neurological: She is alert and oriented to person, place, and time.  Skin: Skin is warm. She is diaphoretic.  Skin moist with pallor, with subcutaneous note patient has fair skin  Nursing note and vitals reviewed.   ED Course  Procedures (including critical care time)  Labs Review Labs Reviewed - No data to display  Imaging Review Dg Chest 2 View  07/08/2015   CLINICAL DATA:  Cough for 2 days with fever.  EXAM: CHEST  2 VIEW  COMPARISON:  None.  FINDINGS: The cardiomediastinal contours are normal. The lungs are clear. Pulmonary vasculature is normal. No consolidation, pleural effusion, or pneumothorax. No acute osseous abnormalities are seen.  IMPRESSION: No acute pulmonary process.   Electronically Signed   By: Jeb Levering M.D.   On: 07/08/2015 21:12     Visual Acuity Review  Right Eye Distance:   Left Eye Distance:   Bilateral Distance:    Right Eye Near:   Left Eye Near:    Bilateral Near:         MDM   1. Viral illness   2. URI (upper respiratory infection)   3. Flu-like symptoms    Tamiflu as directed Drink plenty of fluids stay well-hydrated OTC meds as directed such as Zyrtec and Sudafed PE Rest.      Janne Napoleon, NP 07/08/15 2133

## 2015-07-08 NOTE — ED Notes (Signed)
The patient presented to the [facility] with a complaint of fever, body aches, headaches, cough and chest congestion. The patient stated that the symptoms started 3 days ago.

## 2015-07-11 ENCOUNTER — Ambulatory Visit (INDEPENDENT_AMBULATORY_CARE_PROVIDER_SITE_OTHER): Payer: BLUE CROSS/BLUE SHIELD | Admitting: Physician Assistant

## 2015-07-11 ENCOUNTER — Ambulatory Visit (INDEPENDENT_AMBULATORY_CARE_PROVIDER_SITE_OTHER): Payer: BLUE CROSS/BLUE SHIELD

## 2015-07-11 VITALS — BP 110/60 | HR 89 | Temp 98.4°F | Resp 20 | Ht 64.0 in | Wt 155.2 lb

## 2015-07-11 DIAGNOSIS — J189 Pneumonia, unspecified organism: Secondary | ICD-10-CM

## 2015-07-11 DIAGNOSIS — R05 Cough: Secondary | ICD-10-CM | POA: Diagnosis not present

## 2015-07-11 DIAGNOSIS — R059 Cough, unspecified: Secondary | ICD-10-CM

## 2015-07-11 LAB — POCT CBC
Granulocyte percent: 79.6 %G (ref 37–80)
HCT, POC: 41.8 % (ref 37.7–47.9)
HEMOGLOBIN: 13.1 g/dL (ref 12.2–16.2)
Lymph, poc: 1.3 (ref 0.6–3.4)
MCH: 30 pg (ref 27–31.2)
MCHC: 31.4 g/dL — AB (ref 31.8–35.4)
MCV: 95.5 fL (ref 80–97)
MID (cbc): 0.7 (ref 0–0.9)
MPV: 6.7 fL (ref 0–99.8)
POC GRANULOCYTE: 7.6 — AB (ref 2–6.9)
POC LYMPH PERCENT: 13.4 %L (ref 10–50)
POC MID %: 7 % (ref 0–12)
Platelet Count, POC: 229 10*3/uL (ref 142–424)
RBC: 4.38 M/uL (ref 4.04–5.48)
RDW, POC: 12.6 %
WBC: 9.6 10*3/uL (ref 4.6–10.2)

## 2015-07-11 MED ORDER — AZITHROMYCIN 200 MG/5ML PO SUSR: ORAL | Status: DC

## 2015-07-11 MED ORDER — HYDROCODONE-HOMATROPINE 5-1.5 MG/5ML PO SYRP: 5.0000 mL | ORAL_SOLUTION | Freq: Three times a day (TID) | ORAL | Status: DC | PRN

## 2015-07-11 NOTE — Progress Notes (Signed)
07/11/2015 at 10:25 PM  Lolly Mustache / DOB: 1976-04-30 / MRN: [id number]  The patient has PID (pelvic inflammatory disease); Blood type, Rh negative; and History of chicken pox on her problem list.  SUBJECTIVE  Jacqueline Jimenez is a 39 y.o. well appearing female presenting for the chief complaint of cough and sporadic fever for the last 4 days.  Reports that she had a flu shot 5 days ago and she initially thought her symptoms were secondary to inoculation. She went to an urgent care and imaging was negative for chest infiltrate at that time. She was however prescribed Tamiflu which did not help.  She has tried ibuprofen for fever with good results.  She has not tried anything for cough.     She  has a past medical history of PID (pelvic inflammatory disease); Blood type, Rh negative; and History of chicken pox.    Medications reviewed and updated by myself where necessary, and exist elsewhere in the encounter.   Ms. Hegna has No Known Allergies. She  reports that she has never smoked. She has never used smokeless tobacco. She reports that she drinks alcohol. She reports that she does not use illicit drugs. She  reports that she currently engages in sexual activity. She reports using the following method of birth control/protection: IUD. The patient  has past surgical history that includes Cesarean section; Dilation and curettage of uterus; Rhinoplasty; calf augmentation; club foot correction; and Appendectomy.  Her family history includes Breast cancer in her other; Breast cancer (age of onset: 53) in her mother; Breast cancer (age of onset: 64) in her maternal grandmother; Cancer in her maternal grandmother and mother; Diabetes in her maternal grandmother; Hypertension in her father.  Review of Systems  Constitutional: Negative for fever and chills.  Respiratory: Positive for cough. Negative for hemoptysis, shortness of breath and wheezing.   Cardiovascular: Negative for chest pain.    Gastrointestinal: Negative for nausea and abdominal pain.  Genitourinary: Negative.   Skin: Negative for rash.  Neurological: Negative for dizziness and headaches.    OBJECTIVE  Her  height is 5\' 4"  (1.626 m) and weight is 155 lb 4 oz (70.421 kg). Her oral temperature is 98.4 F (36.9 C). Her blood pressure is 110/60 and her pulse is 89. Her respiration is 20 and oxygen saturation is 95%.  The patient's body mass index is 26.64 kg/(m^2).  Physical Exam  Constitutional: She is oriented to person, place, and time. She appears well-developed and well-nourished. No distress.  Eyes: Pupils are equal, round, and reactive to light.  Cardiovascular: Normal rate.   Respiratory: Effort normal. No respiratory distress. She has rales (right lower lobe).  GI: She exhibits no distension.  Neurological: She is alert and oriented to person, place, and time.  Skin: Skin is warm and dry. She is not diaphoretic.  Psychiatric: She has a normal mood and affect. Her behavior is normal. Judgment and thought content normal.    Results for orders placed or performed in visit on 07/11/15 (from the past 24 hour(s))  POCT CBC     Status: Abnormal   Collection Time: 07/11/15  9:53 PM  Result Value Ref Range   WBC 9.6 4.6 - 10.2 K/uL   Lymph, poc 1.3 0.6 - 3.4   POC LYMPH PERCENT 13.4 10 - 50 %L   MID (cbc) 0.7 0 - 0.9   POC MID % 7.0 0 - 12 %M   POC Granulocyte 7.6 (A) 2 - 6.9  Granulocyte percent 79.6 37 - 80 %G   RBC 4.38 4.04 - 5.48 M/uL   Hemoglobin 13.1 12.2 - 16.2 g/dL   HCT, POC 41.8 37.7 - 47.9 %   MCV 95.5 80 - 97 fL   MCH, POC 30.0 27 - 31.2 pg   MCHC 31.4 (A) 31.8 - 35.4 g/dL   RDW, POC 12.6 %   Platelet Count, POC 229 142 - 424 K/uL   MPV 6.7 0 - 99.8 fL   [facility] reading (PRIMARY) by  Dr. Carlota Raspberry: Early right middle lobe infiltrate.   ASSESSMENT & PLAN  Malarie was seen today for cough and fever.  Diagnoses and all orders for this visit:  Cough -     POCT CBC -     DG Chest 2 View;  Future -     HYDROcodone-homatropine (HYCODAN) 5-1.5 MG/5ML syrup; Take 5 mLs by mouth every 8 (eight) hours as needed for cough.   CAP (community acquired pneumonia) -     azithromycin (ZITHROMAX) 200 MG/5ML suspension; Take 12.5 mLs on day one. Take 6.25 mLs there after until the medication is gone.    The patient was advised to call or come back to clinic if she does not see an improvement in symptoms, or worsens with the above plan.   Philis Fendt, MHS, PA-C [facility] 07/11/2015 10:25 PM

## 2015-07-11 NOTE — Patient Instructions (Signed)
Please return to clinic if your symptoms have not resolved in two days.

## 2015-08-17 ENCOUNTER — Ambulatory Visit (INDEPENDENT_AMBULATORY_CARE_PROVIDER_SITE_OTHER): Payer: BLUE CROSS/BLUE SHIELD | Admitting: Family Medicine

## 2015-08-17 VITALS — BP 126/82 | HR 73 | Temp 98.5°F | Resp 16 | Ht 64.0 in | Wt 152.0 lb

## 2015-08-17 DIAGNOSIS — T2101XA Burn of unspecified degree of chest wall, initial encounter: Secondary | ICD-10-CM | POA: Diagnosis not present

## 2015-08-17 DIAGNOSIS — J309 Allergic rhinitis, unspecified: Secondary | ICD-10-CM | POA: Diagnosis not present

## 2015-08-17 DIAGNOSIS — X12XXXA Contact with other hot fluids, initial encounter: Secondary | ICD-10-CM

## 2015-08-17 DIAGNOSIS — T22032A Burn of unspecified degree of left upper arm, initial encounter: Secondary | ICD-10-CM

## 2015-08-17 DIAGNOSIS — T300 Burn of unspecified body region, unspecified degree: Secondary | ICD-10-CM

## 2015-08-17 MED ORDER — MONTELUKAST SODIUM 10 MG PO TABS: 10.0000 mg | ORAL_TABLET | Freq: Every day | ORAL | Status: DC

## 2015-08-17 MED ORDER — LEVOCETIRIZINE DIHYDROCHLORIDE 5 MG PO TABS: 5.0000 mg | ORAL_TABLET | Freq: Every evening | ORAL | Status: DC

## 2015-08-17 NOTE — Patient Instructions (Signed)
Can use silver sulfadiazine or bacitracin  Burn Care Your skin is a natural barrier to infection. It is the largest organ of your body. Burns damage this natural protection. To help prevent infection, it is very important to follow your caregiver's instructions in the care of your burn. Burns are classified as:  First degree. There is only redness of the skin (erythema). No scarring is expected.  Second degree. There is blistering of the skin. Scarring may occur with deeper burns.  Third degree. All layers of the skin are injured, and scarring is expected. HOME CARE INSTRUCTIONS   Wash your hands well before changing your bandage.  Change your bandage as often as directed by your caregiver.  Remove the old bandage. If the bandage sticks, you may soak it off with cool, clean water.  Cleanse the burn thoroughly but gently with mild soap and water.  Pat the area dry with a clean, dry cloth.  Apply a thin layer of antibacterial cream to the burn.  Apply a clean bandage as instructed by your caregiver.  Keep the bandage as clean and dry as possible.  Elevate the affected area for the first 24 hours, then as instructed by your caregiver.  Only take over-the-counter or prescription medicines for pain, discomfort, or fever as directed by your caregiver. SEEK IMMEDIATE MEDICAL CARE IF:   You develop excessive pain.  You develop redness, tenderness, swelling, or red streaks near the burn.  The burned area develops yellowish-white fluid (pus) or a bad smell.  You have a fever. MAKE SURE YOU:   Understand these instructions.  Will watch your condition.  Will get help right away if you are not doing well or get worse.   This information is not intended to replace advice given to you by your health care provider. Make sure you discuss any questions you have with your health care provider.   Document Released: 10/07/2005 Document Revised: 12/30/2011 Document Reviewed:  02/27/2011 Elsevier Interactive Patient Education [employer].
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
## 2016-01-23 DIAGNOSIS — N3010 Interstitial cystitis (chronic) without hematuria: Secondary | ICD-10-CM | POA: Diagnosis not present

## 2016-03-11 ENCOUNTER — Ambulatory Visit (INDEPENDENT_AMBULATORY_CARE_PROVIDER_SITE_OTHER): Payer: BLUE CROSS/BLUE SHIELD | Admitting: Family Medicine

## 2016-03-11 VITALS — BP 156/92 | HR 102 | Temp 99.1°F | Resp 18 | Ht 64.0 in | Wt 154.0 lb

## 2016-03-11 DIAGNOSIS — G4700 Insomnia, unspecified: Secondary | ICD-10-CM | POA: Diagnosis not present

## 2016-03-11 MED ORDER — LORAZEPAM 0.5 MG PO TABS: 0.5000 mg | ORAL_TABLET | Freq: Every evening | ORAL | Status: DC | PRN

## 2016-03-11 NOTE — Progress Notes (Signed)
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 She lost her aunt 2 diabetes 5 days ago and has not been sleeping. She just finished her semester. In 2 weeks, her step granddaughter is getting married. She would like a temporary prescription for lorazepam to help her sleep.  She is having to fly to [facility] for the funeral.   Objective:  BP 156/92 mmHg  Pulse 102  Temp(Src) 99.1 F (37.3 C) (Oral)  Resp 18  Ht 5\' 4"  (1.626 m)  Wt 154 lb (69.854 kg)  BMI 26.42 kg/m2  SpO2 98%  LMP 02/27/2016 Patient is appropriate and alert.  Assessment: Stress reaction  Insomnia - Plan: LORazepam (ATIVAN) 0.5 MG tablet   Signed, Dr. Robyn Haber

## 2016-03-11 NOTE — Patient Instructions (Addendum)
Insomnia Insomnia is a sleep disorder that makes it difficult to fall asleep or to stay asleep. Insomnia can cause tiredness (fatigue), low energy, difficulty concentrating, mood swings, and poor performance at work or school.  There are three different ways to classify insomnia:  Difficulty falling asleep.  Difficulty staying asleep.  Waking up too early in the morning. Any type of insomnia can be long-term (chronic) or short-term (acute). Both are common. Short-term insomnia usually lasts for three months or less. Chronic insomnia occurs at least three times a week for longer than three months. CAUSES  Insomnia may be caused by another condition, situation, or substance, such as:  Anxiety.  Certain medicines.  Gastroesophageal reflux disease (GERD) or other gastrointestinal conditions.  Asthma or other breathing conditions.  Restless legs syndrome, sleep apnea, or other sleep disorders.  Chronic pain.  Menopause. This may include hot flashes.  Stroke.  Abuse of alcohol, tobacco, or illegal drugs.  Depression.  Caffeine.   Neurological disorders, such as Alzheimer disease.  An overactive thyroid (hyperthyroidism). The cause of insomnia may not be known. RISK FACTORS Risk factors for insomnia include:  Gender. Women are more commonly affected than men.  Age. Insomnia is more common as you get older.  Stress. This may involve your professional or personal life.  Income. Insomnia is more common in people with lower income.  Lack of exercise.   Irregular work schedule or night shifts.  Traveling between different time zones. SIGNS AND SYMPTOMS If you have insomnia, trouble falling asleep or trouble staying asleep is the main symptom. This may lead to other symptoms, such as:  Feeling fatigued.  Feeling nervous about going to sleep.  Not feeling rested in the morning.  Having trouble concentrating.  Feeling irritable, anxious, or depressed. TREATMENT   Treatment for insomnia depends on the cause. If your insomnia is caused by an underlying condition, treatment will focus on addressing the condition. Treatment may also include:   Medicines to help you sleep.  Counseling or therapy.  Lifestyle adjustments. HOME CARE INSTRUCTIONS   Take medicines only as directed by your health care provider.  Keep regular sleeping and waking hours. Avoid naps.  Keep a sleep diary to help you and your health care provider figure out what could be causing your insomnia. Include:   When you sleep.  When you wake up during the night.  How well you sleep.   How rested you feel the next day.  Any side effects of medicines you are taking.  What you eat and drink.   Make your bedroom a comfortable place where it is easy to fall asleep:  Put up shades or special blackout curtains to block light from outside.  Use a white noise machine to block noise.  Keep the temperature cool.   Exercise regularly as directed by your health care provider. Avoid exercising right before bedtime.  Use relaxation techniques to manage stress. Ask your health care provider to suggest some techniques that may work well for you. These may include:  Breathing exercises.  Routines to release muscle tension.  Visualizing peaceful scenes.  Cut back on alcohol, caffeinated beverages, and cigarettes, especially close to bedtime. These can disrupt your sleep.  Do not overeat or eat spicy foods right before bedtime. This can lead to digestive discomfort that can make it hard for you to sleep.  Limit screen use before bedtime. This includes:  Watching TV.  Using your smartphone, tablet, and computer.  Stick to a routine. This   can help you fall asleep faster. Try to do a quiet activity, brush your teeth, and go to bed at the same time each night.  Get out of bed if you are still awake after 15 minutes of trying to sleep. Keep the lights down, but try reading or  doing a quiet activity. When you feel sleepy, go back to bed.  Make sure that you drive carefully. Avoid driving if you feel very sleepy.  Keep all follow-up appointments as directed by your health care provider. This is important. SEEK MEDICAL CARE IF:   You are tired throughout the day or have trouble in your daily routine due to sleepiness.  You continue to have sleep problems or your sleep problems get worse. SEEK IMMEDIATE MEDICAL CARE IF:   You have serious thoughts about hurting yourself or someone else.   This information is not intended to replace advice given to you by your health care provider. Make sure you discuss any questions you have with your health care provider.   Document Released: 10/04/2000 Document Revised: 06/28/2015 Document Reviewed: 07/08/2014 Elsevier Interactive Patient Education 2016 Elsevier Inc.  

## 2016-08-26 ENCOUNTER — Encounter (HOSPITAL_COMMUNITY): Payer: Self-pay | Admitting: Emergency Medicine

## 2016-08-26 ENCOUNTER — Emergency Department (HOSPITAL_COMMUNITY)
Admission: EM | Admit: 2016-08-26 | Discharge: 2016-08-26 | Disposition: A | Discharge status: Home / Self Care | Payer: BLUE CROSS/BLUE SHIELD | Attending: Emergency Medicine | Admitting: Emergency Medicine

## 2016-08-26 DIAGNOSIS — Y929 Unspecified place or not applicable: Secondary | ICD-10-CM | POA: Insufficient documentation

## 2016-08-26 DIAGNOSIS — W228XXA Striking against or struck by other objects, initial encounter: Secondary | ICD-10-CM | POA: Diagnosis not present

## 2016-08-26 DIAGNOSIS — S30857A Superficial foreign body of anus, initial encounter: Secondary | ICD-10-CM | POA: Diagnosis not present

## 2016-08-26 DIAGNOSIS — T185XXA Foreign body in anus and rectum, initial encounter: Secondary | ICD-10-CM | POA: Diagnosis not present

## 2016-08-26 DIAGNOSIS — Y999 Unspecified external cause status: Secondary | ICD-10-CM | POA: Insufficient documentation

## 2016-08-26 DIAGNOSIS — Y939 Activity, unspecified: Secondary | ICD-10-CM | POA: Diagnosis not present

## 2016-08-26 MED ORDER — MIDAZOLAM HCL 2 MG/2ML IJ SOLN
4.0000 mg | Freq: Once | INTRAMUSCULAR | Status: AC
  Administered 2016-08-26: 4 mg via INTRAVENOUS
  Filled 2016-08-26: qty 4

## 2016-08-26 MED ORDER — LIDOCAINE HCL 2 % EX GEL
1.0000 "application " | Freq: Once | CUTANEOUS | Status: AC
  Administered 2016-08-26: 1 via TOPICAL
  Filled 2016-08-26: qty 22

## 2016-08-26 NOTE — ED Triage Notes (Signed)
Pt c/o retained vibrating sexual toy in anus onset today around 0430. Reports toy is small, about finger sized, with battery. Toy is still vibrating. Pt attempted to remove without success. No bleeding.

## 2016-08-26 NOTE — Discharge Instructions (Signed)
You were evaluated today for a foreign body in your rectum. The object was removed and I see no signs of injury. Please avoid any further objects in your rectum and monitor for any bleeding, abdominal pain, or vomiting. If you experience any of the symptoms, please return immediately.
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
## 2016-09-13 ENCOUNTER — Other Ambulatory Visit: Payer: Self-pay | Admitting: Family Medicine

## 2016-09-13 DIAGNOSIS — J309 Allergic rhinitis, unspecified: Secondary | ICD-10-CM

## 2016-09-26 NOTE — Telephone Encounter (Signed)
02/2016 LAST OV LAST REFILL 07/2015

## 2016-12-19 DIAGNOSIS — Z309 Encounter for contraceptive management, unspecified: Secondary | ICD-10-CM | POA: Diagnosis not present

## 2016-12-19 DIAGNOSIS — F902 Attention-deficit hyperactivity disorder, combined type: Secondary | ICD-10-CM | POA: Diagnosis not present

## 2016-12-19 DIAGNOSIS — R112 Nausea with vomiting, unspecified: Secondary | ICD-10-CM | POA: Diagnosis not present

## 2016-12-19 DIAGNOSIS — R197 Diarrhea, unspecified: Secondary | ICD-10-CM | POA: Diagnosis not present

## 2016-12-23 DIAGNOSIS — Z30430 Encounter for insertion of intrauterine contraceptive device: Secondary | ICD-10-CM | POA: Diagnosis not present

## 2016-12-23 DIAGNOSIS — Z113 Encounter for screening for infections with a predominantly sexual mode of transmission: Secondary | ICD-10-CM | POA: Diagnosis not present

## 2017-01-15 ENCOUNTER — Other Ambulatory Visit: Payer: Self-pay | Admitting: Physician Assistant

## 2017-01-15 DIAGNOSIS — J309 Allergic rhinitis, unspecified: Secondary | ICD-10-CM

## 2017-01-16 DIAGNOSIS — Z6827 Body mass index (BMI) 27.0-27.9, adult: Secondary | ICD-10-CM | POA: Diagnosis not present

## 2017-01-16 DIAGNOSIS — Z01419 Encounter for gynecological examination (general) (routine) without abnormal findings: Secondary | ICD-10-CM | POA: Diagnosis not present

## 2017-01-16 DIAGNOSIS — Z113 Encounter for screening for infections with a predominantly sexual mode of transmission: Secondary | ICD-10-CM | POA: Diagnosis not present

## 2017-01-16 DIAGNOSIS — Z3040 Encounter for surveillance of contraceptives, unspecified: Secondary | ICD-10-CM | POA: Diagnosis not present

## 2017-01-16 DIAGNOSIS — Z1231 Encounter for screening mammogram for malignant neoplasm of breast: Secondary | ICD-10-CM | POA: Diagnosis not present

## 2017-02-04 ENCOUNTER — Ambulatory Visit (INDEPENDENT_AMBULATORY_CARE_PROVIDER_SITE_OTHER): Payer: BLUE CROSS/BLUE SHIELD | Admitting: Family Medicine

## 2017-02-04 VITALS — BP 131/85 | HR 80 | Temp 98.2°F | Resp 18 | Ht 64.0 in | Wt 160.8 lb

## 2017-02-04 DIAGNOSIS — F40243 Fear of flying: Secondary | ICD-10-CM

## 2017-02-04 DIAGNOSIS — R05 Cough: Secondary | ICD-10-CM

## 2017-02-04 DIAGNOSIS — L732 Hidradenitis suppurativa: Secondary | ICD-10-CM

## 2017-02-04 DIAGNOSIS — R059 Cough, unspecified: Secondary | ICD-10-CM

## 2017-02-04 MED ORDER — ONDANSETRON HCL 4 MG PO TABS: 4.0000 mg | ORAL_TABLET | Freq: Three times a day (TID) | ORAL | 0 refills | Status: DC | PRN

## 2017-02-04 MED ORDER — DOXYCYCLINE HYCLATE 100 MG PO TABS: 100.0000 mg | ORAL_TABLET | Freq: Two times a day (BID) | ORAL | 0 refills | Status: DC

## 2017-02-04 MED ORDER — LORAZEPAM 0.5 MG PO TABS
0.5000 mg | ORAL_TABLET | Freq: Every evening | ORAL | 1 refills | Status: DC | PRN
Start: 2017-02-04 — End: 2017-07-02

## 2017-02-04 MED ORDER — FLUCONAZOLE 150 MG PO TABS: 150.0000 mg | ORAL_TABLET | Freq: Every day | ORAL | 0 refills | Status: DC

## 2017-02-04 MED ORDER — CHLORHEXIDINE GLUCONATE 4 % EX LIQD: Freq: Every day | CUTANEOUS | 0 refills | Status: DC | PRN

## 2017-02-04 NOTE — Patient Instructions (Addendum)
IF you received an x-ray today, you will receive an invoice from [facility]. Please contact [facility] at [phone] with questions or concerns regarding your invoice.   IF you received labwork today, you will receive an invoice from [facility]. Please contact [facility] at [phone] with questions or concerns regarding your invoice.   Our billing staff will not be able to assist you with questions regarding bills from these companies.  You will be contacted with the lab results as soon as they are available. The fastest way to get your results is to activate your My Chart account. Instructions are located on the last page of this paperwork. If you have not heard from [place] regarding the results in 2 weeks, please contact this office.    Hidradenitis Suppurativa Hidradenitis suppurativa is a long-term (chronic) skin disease that starts with blocked sweat glands or hair follicles. Bacteria may grow in these blocked openings of your skin. Hidradenitis suppurativa is like a severe form of acne that develops in areas of your body where acne would be unusual. It is most likely to affect the areas of your body where skin rubs against skin and becomes moist. This includes your:  Underarms.  Groin.  Genital areas.  Buttocks.  Upper thighs.  Breasts. Hidradenitis suppurativa may start out with small pimples. The pimples can develop into deep sores that break open (rupture) and drain pus. Over time your skin may thicken and become scarred. Hidradenitis suppurativa cannot be passed from person to person. What are the causes? The exact cause of hidradenitis suppurativa is not known. This condition may be due to:  Female and female hormones. The condition is rare before and after puberty.  An overactive body defense system (immune system). Your immune system may overreact to the blocked hair follicles or sweat glands and cause swelling and pus-filled sores. What increases the  risk? You may have a higher risk of hidradenitis suppurativa if you:  Are a woman.  Are between ages 12 and 56.  Have a family history of hidradenitis suppurativa.  Have a personal history of acne.  Are overweight.  Smoke.  Take the drug lithium. What are the signs or symptoms? The first signs of an outbreak are usually painful skin bumps that look like pimples. As the condition progresses:  Skin bumps may get bigger and grow deeper into the skin.  Bumps under the skin may rupture and drain smelly pus.  Skin may become itchy and infected.  Skin may thicken and scar.  Drainage may continue through tunnels under the skin (fistulas).  Walking and moving your arms can become painful. How is this diagnosed? Your health care provider may diagnose hidradenitis suppurativa based on your medical history and your signs and symptoms. A physical exam will also be done. You may need to see a health care provider who specializes in skin diseases (dermatologist). You may also have tests done to confirm the diagnosis. These can include:  Swabbing a sample of pus or drainage from your skin so it can be sent to the lab and tested for infection.  Blood tests to check for infection. How is this treated? The same treatment will not work for everybody with hidradenitis suppurativa. Your treatment will depend on how severe your symptoms are. You may need to try several treatments to find what works best for you. Part of your treatment may include cleaning and bandaging (dressing) your wounds. You may also have to take medicines, such as the following:  Antibiotics.  Acne medicines.  Medicines to block or suppress the immune system.  A diabetes medicine (metformin) is sometimes used to treat this condition.  For women, birth control pills can sometimes help relieve symptoms. You may need surgery if you have a severe case of hidradenitis suppurativa that does not respond to medicine. Surgery  may involve:  Using a laser to clear the skin and remove hair follicles.  Opening and draining deep sores.  Removing the areas of skin that are diseased and scarred. Follow these instructions at home:  Learn as much as you can about your disease, and work closely with your health care providers.  Take medicines only as directed by your health care provider.  If you were prescribed an antibiotic medicine, finish it all even if you start to feel better.  If you are overweight, losing weight may be very helpful. Try to reach and maintain a healthy weight.  Do not use any tobacco products, including cigarettes, chewing tobacco, or electronic cigarettes. If you need help quitting, ask your health care provider.  Do not shave the areas where you get hidradenitis suppurativa.  Do not wear deodorant.  Wear loose-fitting clothes.  Try not to overheat and get sweaty.  Take a daily bleach bath as directed by your health care provider.  Fill your bathtub halfway with water.  Pour in  cup of unscented household bleach.  Soak for 5-10 minutes.  Cover sore areas with a warm, clean washcloth (compress) for 5-10 minutes. Contact a health care provider if:  You have a flare-up of hidradenitis suppurativa.  You have chills or a fever.  You are having trouble controlling your symptoms at home. This information is not intended to replace advice given to you by your health care provider. Make sure you discuss any questions you have with your health care provider. Document Released: 05/21/2004 Document Revised: 03/14/2016 Document Reviewed: 01/07/2014 Elsevier Interactive Patient Education  2017 [place] have received the antibiotic doxycycline. It may take several days before you feel any benefit from this medicine. Be sure to take all the medication prescribed, even if you are feeling better before it is finished. Do not drink alcohol while taking antibiotics.  It  is unknown whether you will develop an adverse reaction (diarrhea, rash, nausea or vomiting) or an allergic reaction (trouble breathing, anaphylaxis).

## 2017-02-04 NOTE — Progress Notes (Signed)
Chief Complaint  Patient presents with  . Mass    Under left arm pit. Pt states she had a mammogram last month.  . Medication Refill    Ativan    HPI  Axillae lesion- new problem Pt reports that she had a normal mammogram March 2018  3 days ago she noticed tenderness in the left axillae She reports that she is wondering if the mammogram missed something She has not been shaving or using deoderant She denies fevers or chills No arm pain or numbness in fingers  Fear of flying She has anxiety about flying and wanted to ask for medicines to help with her flight She reports that she needs it for the airport, the flight and landing.   cough She has seasonal allergies and is taking her antihistamine and flonase She reports that she is still sneezing She wanted to be checked for a dry cough  Past Medical History:  Diagnosis Date  . Blood type, Rh negative   . History of chicken pox   . PID (pelvic inflammatory disease)     Current Outpatient Prescriptions  Medication Sig Dispense Refill  . amphetamine-dextroamphetamine (ADDERALL XR) 30 MG 24 hr capsule Take 30 mg by mouth daily as needed. For when in school    . amphetamine-dextroamphetamine (ADDERALL) 5 MG tablet Take 5-10 mg by mouth daily as needed. For when taking night classes    . fluticasone (FLONASE) 50 MCG/ACT nasal spray Place 1 spray into both nostrils daily.    Marland Kitchen levocetirizine (XYZAL) 5 MG tablet Take 1 tablet (5 mg total) by mouth every evening. (Patient taking differently: Take 5 mg by mouth every morning. ) 30 tablet 5  . LORazepam (ATIVAN) 0.5 MG tablet Take 1 tablet (0.5 mg total) by mouth at bedtime as needed for anxiety. 10 tablet 1  . montelukast (SINGULAIR) 10 MG tablet TAKE 1 TABLET (10 MG TOTAL) BY MOUTH AT BEDTIME. 30 tablet 0  . chlorhexidine (HIBICLENS) 4 % external liquid Apply topically daily as needed. 120 mL 0  . doxycycline (VIBRA-TABS) 100 MG tablet Take 1 tablet (100 mg total) by mouth 2 (two)  times daily. 20 tablet 0  . fluconazole (DIFLUCAN) 150 MG tablet Take 1 tablet (150 mg total) by mouth daily. Repeat in 3 days for yeast. 2 tablet 0  . ondansetron (ZOFRAN) 4 MG tablet Take 1 tablet (4 mg total) by mouth every 8 (eight) hours as needed for nausea or vomiting. 20 tablet 0   No current facility-administered medications for this visit.     Allergies: No Known Allergies  Past Surgical History:  Procedure Laterality Date  . APPENDECTOMY    . calf augmentation    . CESAREAN SECTION    . club foot correction    . DILATION AND CURETTAGE OF UTERUS    . RHINOPLASTY      Social History   Social History  . Marital status: Married    Spouse name: N/A  . Number of children: N/A  . Years of education: N/A   Social History Main Topics  . Smoking status: Never Smoker  . Smokeless tobacco: Never Used  . Alcohol use Yes  . Drug use: No  . Sexual activity: Yes    Birth control/ protection: IUD     Comment: mirena   Other Topics Concern  . None   Social History Narrative  . None    Review of Systems  Constitutional: Negative for diaphoresis.  Cardiovascular: Negative for chest pain, palpitations  and orthopnea.  Skin: Negative for itching and rash.  Neurological: Negative for weakness.  Endo/Heme/Allergies: Does not bruise/bleed easily.  Psychiatric/Behavioral: The patient is nervous/anxious.     Objective: Vitals:   02/04/17 1408  BP: 131/85  Pulse: 80  Resp: 18  Temp: 98.2 F (36.8 C)  TempSrc: Oral  SpO2: 93%  Weight: 160 lb 12.8 oz (72.9 kg)  Height: 5\' 4"  (1.626 m)    Physical Exam  Constitutional: She is oriented to person, place, and time. She appears well-developed and well-nourished.  HENT:  Head: Normocephalic and atraumatic.  Eyes: Conjunctivae and EOM are normal.  Cardiovascular: Normal rate, regular rhythm and normal heart sounds.   Pulmonary/Chest: Effort normal and breath sounds normal. No respiratory distress. She has no wheezes.     Neurological: She is alert and oriented to person, place, and time.    Assessment and Plan Jolisa was seen today for mass and medication refill.  Diagnoses and all orders for this visit:  Hidradenitis suppurativa of left axilla- discussed washing with hibiclens To use doxycycline oral Pt reports nausea from doxycycline therefore zofran was given Diflucan given incase she develops yeast infection while traveling -     chlorhexidine (HIBICLENS) 4 % external liquid; Apply topically daily as needed. -     ondansetron (ZOFRAN) 4 MG tablet; Take 1 tablet (4 mg total) by mouth every 8 (eight) hours as needed for nausea or vomiting. -     doxycycline (VIBRA-TABS) 100 MG tablet; Take 1 tablet (100 mg total) by mouth 2 (two) times daily. -     fluconazole (DIFLUCAN) 150 MG tablet; Take 1 tablet (150 mg total) by mouth daily. Repeat in 3 days for yeast.  Fear of flying- prn ativan given only ten tabs -     LORazepam (ATIVAN) 0.5 MG tablet; Take 1 tablet (0.5 mg total) by mouth at bedtime as needed for anxiety.  Cough- continue allergy medications, normal pulm exam  Delvon Chipps A Hart Haas.

## 2017-02-20 DIAGNOSIS — R1010 Upper abdominal pain, unspecified: Secondary | ICD-10-CM | POA: Diagnosis not present

## 2017-02-20 DIAGNOSIS — R1084 Generalized abdominal pain: Secondary | ICD-10-CM | POA: Diagnosis not present

## 2017-02-20 DIAGNOSIS — Z79899 Other long term (current) drug therapy: Secondary | ICD-10-CM | POA: Diagnosis not present

## 2017-02-20 DIAGNOSIS — R1013 Epigastric pain: Secondary | ICD-10-CM | POA: Insufficient documentation

## 2017-02-20 DIAGNOSIS — R109 Unspecified abdominal pain: Secondary | ICD-10-CM | POA: Diagnosis not present

## 2017-02-21 ENCOUNTER — Emergency Department (HOSPITAL_COMMUNITY)
Admission: EM | Admit: 2017-02-21 | Discharge: 2017-02-21 | Disposition: A | Discharge status: Home / Self Care | Payer: BLUE CROSS/BLUE SHIELD | Attending: Emergency Medicine | Admitting: Emergency Medicine

## 2017-02-21 ENCOUNTER — Emergency Department (HOSPITAL_COMMUNITY): Payer: BLUE CROSS/BLUE SHIELD

## 2017-02-21 ENCOUNTER — Encounter (HOSPITAL_COMMUNITY): Payer: Self-pay | Admitting: Emergency Medicine

## 2017-02-21 DIAGNOSIS — R1013 Epigastric pain: Secondary | ICD-10-CM

## 2017-02-21 DIAGNOSIS — R109 Unspecified abdominal pain: Secondary | ICD-10-CM | POA: Diagnosis not present

## 2017-02-21 HISTORY — DX: Adult hypertrophic pyloric stenosis: K31.1

## 2017-02-21 LAB — COMPREHENSIVE METABOLIC PANEL
ALBUMIN: 4.4 g/dL (ref 3.5–5.0)
ALK PHOS: 75 U/L (ref 38–126)
ALT: 31 U/L (ref 14–54)
AST: 33 U/L (ref 15–41)
Anion gap: 10 (ref 5–15)
BILIRUBIN TOTAL: 0.5 mg/dL (ref 0.3–1.2)
BUN: 11 mg/dL (ref 6–20)
CALCIUM: 9.3 mg/dL (ref 8.9–10.3)
CO2: 24 mmol/L (ref 22–32)
Chloride: 103 mmol/L (ref 101–111)
Creatinine, Ser: 0.73 mg/dL (ref 0.44–1.00)
GFR calc Af Amer: >60 mL/min (ref >60)
GFR calc non Af Amer: >60 mL/min (ref >60)
GLUCOSE: 119 mg/dL — AB (ref 65–99)
POTASSIUM: 4.2 mmol/L (ref 3.5–5.1)
SODIUM: 137 mmol/L (ref 135–145)
TOTAL PROTEIN: 7.8 g/dL (ref 6.5–8.1)

## 2017-02-21 LAB — URINALYSIS, ROUTINE W REFLEX MICROSCOPIC
Bilirubin Urine: NEGATIVE
Glucose, UA: NEGATIVE mg/dL
Ketones, ur: NEGATIVE mg/dL
Leukocytes, UA: NEGATIVE
Nitrite: NEGATIVE
PROTEIN: NEGATIVE mg/dL
SPECIFIC GRAVITY, URINE: 1.003 — AB (ref 1.005–1.030)
WBC UA: NONE SEEN WBC/hpf (ref 0–5)
pH: 6 (ref 5.0–8.0)

## 2017-02-21 LAB — PREGNANCY, URINE: PREG TEST UR: NEGATIVE

## 2017-02-21 LAB — CBC
HEMATOCRIT: 41.5 % (ref 36.0–46.0)
HEMOGLOBIN: 13.9 g/dL (ref 12.0–15.0)
MCH: 32.6 pg (ref 26.0–34.0)
MCHC: 33.5 g/dL (ref 30.0–36.0)
MCV: 97.4 fL (ref 78.0–100.0)
Platelets: 255 10*3/uL (ref 150–400)
RBC: 4.26 MIL/uL (ref 3.87–5.11)
RDW: 12.1 % (ref 11.5–15.5)
WBC: 12.8 10*3/uL — ABNORMAL HIGH (ref 4.0–10.5)

## 2017-02-21 LAB — LIPASE, BLOOD: Lipase: 161 U/L — ABNORMAL HIGH (ref 11–51)

## 2017-02-21 MED ORDER — IOPAMIDOL (ISOVUE-300) INJECTION 61%
100.0000 mL | Freq: Once | INTRAVENOUS | Status: AC | PRN
  Administered 2017-02-21: 100 mL via INTRAVENOUS

## 2017-02-21 MED ORDER — IOPAMIDOL (ISOVUE-300) INJECTION 61%
INTRAVENOUS | Status: AC
  Administered 2017-02-21: 100 mL via INTRAVENOUS
  Filled 2017-02-21: qty 100

## 2017-02-21 MED ORDER — HYDROMORPHONE HCL 1 MG/ML IJ SOLN
1.0000 mg | Freq: Once | INTRAMUSCULAR | Status: AC
Start: 2017-02-21 — End: 2017-02-21
  Administered 2017-02-21: 1 mg via INTRAVENOUS
  Filled 2017-02-21: qty 1

## 2017-02-21 MED ORDER — PROMETHAZINE HCL 25 MG PO TABS: 25.0000 mg | ORAL_TABLET | Freq: Four times a day (QID) | ORAL | 0 refills | Status: DC | PRN

## 2017-02-21 MED ORDER — SODIUM CHLORIDE 0.9 % IV BOLUS (SEPSIS)
1000.0000 mL | Freq: Once | INTRAVENOUS | Status: AC
  Administered 2017-02-21: 1000 mL via INTRAVENOUS

## 2017-02-21 MED ORDER — MORPHINE SULFATE (PF) 4 MG/ML IV SOLN
4.0000 mg | Freq: Once | INTRAVENOUS | Status: AC
  Administered 2017-02-21: 4 mg via INTRAVENOUS
  Filled 2017-02-21: qty 1

## 2017-02-21 NOTE — Discharge Instructions (Signed)
Take your medication as prescribed as an for nausea, continue taking her pain medications as prescribed. Avoid drinking alcohol. Eat smaller, more frequent meals. Drink enough fluid to keep your urine clear or pale yellow. I recommend file with your primary care provider within the next 3 days for follow-up evaluation. If your symptoms continue schedule a follow-up appointment with the gastroenterology clinic listed below.

## 2017-02-21 NOTE — ED Notes (Signed)
Patient is alert and oriented x3.  She was given DC instructions and follow up visit instructions.  Patient gave verbal understanding. She was DC ambulatory under her own power to home.  V/S stable.  He was not showing any signs of distress on DC 

## 2017-02-21 NOTE — ED Provider Notes (Signed)
[facility] DEPT Provider Note   CSN: [id number] Arrival date & time: 02/20/17  2358     History   Chief Complaint Chief Complaint  Patient presents with  . Abdominal Pain    HPI Jacqueline Jimenez is a 41 y.o. female.  HPI   Patient is a 41 year old female with history of PID, pyloric stenosis who presents the ED with complaint of upper abdominal pain, onset 2 days. Patient reports having constant cramping sensation to her upper abdomen that began radiating around to her back today. Denies any aggravating or alleviating factors. She reports taking Zofran at home due to thinking her pain was related to nausea but denies relief of symptoms. Reports having a fever at home yesterday that resolved on its own. Denies cough, shortness of breath, chest pain, vomiting, diarrhea, constipation, flank pain, urinary symptoms, blood in urine or stool, vaginal discharge. Patient reports having mild vaginal bleeding which she describes as "spotting" and notes that she has been spotting since having her IUD placed 6 weeks ago. She reports taking Gas-X, Imodium, Pepcid, Advil and Pepto-Bismol at home without relief. Endorses abdominal surgical history of appendectomy, inguinal hernia repair. Patient notes she is currently on methadone.  Past Medical History:  Diagnosis Date  . Blood type, Rh negative   . History of chicken pox   . PID (pelvic inflammatory disease)   . Pyloric stenosis     Patient Active Problem List   Diagnosis Date Noted  . IC (interstitial cystitis) 01/23/2016  . PID (pelvic inflammatory disease)   . Blood type, Rh negative   . History of chicken pox     Past Surgical History:  Procedure Laterality Date  . APPENDECTOMY    . calf augmentation    . CESAREAN SECTION    . club foot correction    . DILATION AND CURETTAGE OF UTERUS    . HERNIA REPAIR    . RHINOPLASTY      OB History    Gravida Para Term Preterm AB Living   4 1 0 0 3 1   SAB TAB Ectopic Multiple Live  Births   2 1 0 0 1       Home Medications    Prior to Admission medications   Medication Sig Start Date End Date Taking? Authorizing Provider  famotidine-calcium carbonate-magnesium hydroxide (PEPCID COMPLETE) [phone] MG chewable tablet Chew 1 tablet by mouth daily.   Yes Historical Provider, MD  fluticasone (FLONASE) 50 MCG/ACT nasal spray Place 1 spray into both nostrils daily.   Yes Historical Provider, MD  ibuprofen (ADVIL,MOTRIN) 200 MG tablet Take 400 mg by mouth every 6 (six) hours as needed for moderate pain.   Yes Historical Provider, MD  levocetirizine (XYZAL) 5 MG tablet Take 1 tablet (5 mg total) by mouth every evening. Patient taking differently: Take 5 mg by mouth every morning.  08/17/15  Yes Elby Beck, FNP  LORazepam (ATIVAN) 0.5 MG tablet Take 1 tablet (0.5 mg total) by mouth at bedtime as needed for anxiety. 02/04/17  Yes Forrest Moron, MD  methadone (DOLOPHINE) 10 MG/ML solution Take 170 mg by mouth daily.   Yes Historical Provider, MD  montelukast (SINGULAIR) 10 MG tablet TAKE 1 TABLET (10 MG TOTAL) BY MOUTH AT BEDTIME. 09/26/16  Yes Mancel Bale, PA-C  chlorhexidine (HIBICLENS) 4 % external liquid Apply topically daily as needed. Patient not taking: Reported on 02/21/2017 02/04/17   Forrest Moron, MD  doxycycline (VIBRA-TABS) 100 MG tablet Take 1 tablet (  100 mg total) by mouth 2 (two) times daily. Patient not taking: Reported on 02/21/2017 02/04/17   Forrest Moron, MD  fluconazole (DIFLUCAN) 150 MG tablet Take 1 tablet (150 mg total) by mouth daily. Repeat in 3 days for yeast. Patient not taking: Reported on 02/21/2017 02/04/17   Forrest Moron, MD  ondansetron (ZOFRAN) 4 MG tablet Take 1 tablet (4 mg total) by mouth every 8 (eight) hours as needed for nausea or vomiting. Patient not taking: Reported on 02/21/2017 02/04/17   Forrest Moron, MD  promethazine (PHENERGAN) 25 MG tablet Take 1 tablet (25 mg total) by mouth every 6 (six) hours as needed for nausea or  vomiting. 02/21/17   Nona Dell, PA-C    Family History Family History  Problem Relation Age of Onset  . Diabetes Maternal Grandmother   . Cancer Maternal Grandmother   . Breast cancer Maternal Grandmother 31  . Hypertension Father   . Cancer Mother   . Breast cancer Mother 73    survivor  . Breast cancer Other     unknown age and pased away    Social History Social History  Substance Use Topics  . Smoking status: Never Smoker  . Smokeless tobacco: Never Used  . Alcohol use Yes     Allergies   Patient has no known allergies.   Review of Systems Review of Systems  Gastrointestinal: Positive for abdominal pain.  All other systems reviewed and are negative.    Physical Exam Updated Vital Signs BP 119/67   Pulse 88   Temp 98 F (36.7 C) (Oral)   Resp 18   SpO2 98%   Physical Exam  Constitutional: She is oriented to person, place, and time. She appears well-developed and well-nourished. No distress.  HENT:  Head: Normocephalic and atraumatic.  Mouth/Throat: Oropharynx is clear and moist. No oropharyngeal exudate.  Eyes: Conjunctivae and EOM are normal. Right eye exhibits no discharge. Left eye exhibits no discharge. No scleral icterus.  Neck: Normal range of motion. Neck supple.  Cardiovascular: Normal rate, regular rhythm, normal heart sounds and intact distal pulses.   Pulmonary/Chest: Effort normal and breath sounds normal. No respiratory distress. She has no wheezes. She has no rales. She exhibits no tenderness.  Abdominal: Soft. Normal appearance and bowel sounds are normal. She exhibits no distension and no mass. There is tenderness. There is no rigidity, no rebound, no guarding and no CVA tenderness. No hernia.  TTP over upper abdominal quadrants, slightly more tender over epigastric and periumbilical region.   Musculoskeletal: Normal range of motion. She exhibits no edema.  Neurological: She is alert and oriented to person, place, and time.    Skin: Skin is warm and dry. She is not diaphoretic.  Nursing note and vitals reviewed.    ED Treatments / Results  Labs (all labs ordered are listed, but only abnormal results are displayed) Labs Reviewed  LIPASE, BLOOD - Abnormal; Notable for the following:       Result Value   Lipase 161 (*)    All other components within normal limits  COMPREHENSIVE METABOLIC PANEL - Abnormal; Notable for the following:    Glucose, Bld 119 (*)    All other components within normal limits  CBC - Abnormal; Notable for the following:    WBC 12.8 (*)    All other components within normal limits  URINALYSIS, ROUTINE W REFLEX MICROSCOPIC - Abnormal; Notable for the following:    Specific Gravity, Urine 1.003 (*)  Hgb urine dipstick MODERATE (*)    Bacteria, UA RARE (*)    Squamous Epithelial / LPF 0-5 (*)    All other components within normal limits  PREGNANCY, URINE    EKG  EKG Interpretation None       Radiology Ct Abdomen Pelvis W Contrast  Result Date: 02/21/2017 CLINICAL DATA:  Upper abdominal pain for 3 days.  Nausea. EXAM: CT ABDOMEN AND PELVIS WITH CONTRAST TECHNIQUE: Multidetector CT imaging of the abdomen and pelvis was performed using the standard protocol following bolus administration of intravenous contrast. CONTRAST:  100 mL Isovue-300 intravenous COMPARISON:  07/11/2015 FINDINGS: Lower chest: No acute abnormality. Hepatobiliary: Diffuse fatty infiltration of the liver without focal lesion. Gallbladder and bile ducts are unremarkable. Pancreas: Unremarkable. No pancreatic ductal dilatation or surrounding inflammatory changes. Spleen: Normal in size without focal abnormality. Adrenals/Urinary Tract: Adrenal glands are unremarkable. Kidneys are normal, without renal calculi, focal lesion, or hydronephrosis. Bladder is unremarkable. Stomach/Bowel: Stomach is within normal limits. Appendectomy. Colon appears normal. No evidence of bowel wall thickening, distention, or inflammatory  changes. Vascular/Lymphatic: No significant vascular findings are present. No enlarged abdominal or pelvic lymph nodes. Reproductive: Uterus and bilateral adnexa are unremarkable. IUD appears satisfactorily positioned. Other: No focal inflammation.  No ascites. Musculoskeletal: No significant skeletal lesion. IMPRESSION: Hepatic steatosis.  Otherwise unremarkable. Electronically Signed   By: Andreas Newport M.D.   On: 02/21/2017 05:53    Procedures Procedures (including critical care time)  Medications Ordered in ED Medications  sodium chloride 0.9 % bolus 1,000 mL (0 mLs Intravenous Stopped 02/21/17 0629)  morphine 4 MG/ML injection 4 mg (4 mg Intravenous Given 02/21/17 0528)  iopamidol (ISOVUE-300) 61 % injection 100 mL (100 mLs Intravenous Contrast Given 02/21/17 0540)  HYDROmorphone (DILAUDID) injection 1 mg (1 mg Intravenous Given 02/21/17 0629)     Initial Impression / Assessment and Plan / ED Course  I have reviewed the triage vital signs and the nursing notes.  Pertinent labs & imaging results that were available during my care of the patient were reviewed by me and considered in my medical decision making (see chart for details).     Patient presents with upper abdominal pain with associated nausea for the past 2 days. Denies fever. VSS. Exam revealed tenderness over upper abdominal quadrants, slightly worse over epigastric and periumbilical region, no peritoneal signs. No CVA tenderness. Remaining exam unremarkable. Patient given IV fluids and pain meds. Labs revealed WBC 12.8, lipase 161. UA revealed moderate hemoglobin but patient reports having light vaginal bleeding. Pt also reports drinking "more than she should" and states she drinks usually 4 times per week. Due to pt without hx of pancreatitis, mild leukocytosis and elevated lipase will order CT abd for further evaluation. CT showed hepatic steatosis, otherwise unremarkable. On reevaluation patient is sitting resting comfortably in  bed. Patient able to tolerate by mouth. Suspect patient's symptoms are likely related to very mild episode of acute pancreatitis. Due to patient being on methadone at home, plan to discharge home with Zofran and symptomatic treatment along with outpatient GI follow-up. Discussed return precautions.  Final Clinical Impressions(s) / ED Diagnoses   Final diagnoses:  Epigastric pain    New Prescriptions New Prescriptions   PROMETHAZINE (PHENERGAN) 25 MG TABLET    Take 1 tablet (25 mg total) by mouth every 6 (six) hours as needed for nausea or vomiting.     Chesley Noon Dexter, [place] 02/21/17 2979    April Palumbo, MD 02/21/17 [phone]

## 2017-02-21 NOTE — ED Triage Notes (Addendum)
Pt from home with complaints of upper abdominal pain x 3 days. Pt denies emesis and denies diarrhea. Pt has home rx for zofran and has been using it for nausea. Pt denies urinary symptoms, but states pain radiates to right flank occasionally. Pt rates pain 4/10

## 2017-06-19 DIAGNOSIS — N941 Unspecified dyspareunia: Secondary | ICD-10-CM | POA: Diagnosis not present

## 2017-06-19 DIAGNOSIS — N94810 Vulvar vestibulitis: Secondary | ICD-10-CM | POA: Diagnosis not present

## 2017-06-19 DIAGNOSIS — N898 Other specified noninflammatory disorders of vagina: Secondary | ICD-10-CM | POA: Diagnosis not present

## 2017-07-02 ENCOUNTER — Ambulatory Visit (INDEPENDENT_AMBULATORY_CARE_PROVIDER_SITE_OTHER): Payer: BLUE CROSS/BLUE SHIELD | Admitting: Family Medicine

## 2017-07-02 ENCOUNTER — Encounter: Payer: Self-pay | Admitting: Family Medicine

## 2017-07-02 VITALS — BP 134/85 | HR 68 | Temp 98.3°F | Resp 16 | Ht 64.0 in | Wt 160.4 lb

## 2017-07-02 DIAGNOSIS — Z23 Encounter for immunization: Secondary | ICD-10-CM

## 2017-07-02 DIAGNOSIS — F40243 Fear of flying: Secondary | ICD-10-CM | POA: Diagnosis not present

## 2017-07-02 DIAGNOSIS — L509 Urticaria, unspecified: Secondary | ICD-10-CM

## 2017-07-02 MED ORDER — LORAZEPAM 0.5 MG PO TABS: 0.5000 mg | ORAL_TABLET | Freq: Every evening | ORAL | 1 refills | Status: DC | PRN

## 2017-07-02 NOTE — Patient Instructions (Addendum)
IF you received an x-ray today, you will receive an invoice from [facility]. Please contact [facility] at [phone] with questions or concerns regarding your invoice.   IF you received labwork today, you will receive an invoice from [facility]. Please contact [facility] at [phone] with questions or concerns regarding your invoice.   Our billing staff will not be able to assist you with questions regarding bills from these companies.  You will be contacted with the lab results as soon as they are available. The fastest way to get your results is to activate your My Chart account. Instructions are located on the last page of this paperwork. If you have not heard from [place] regarding the results in 2 weeks, please contact this office.     Hives Hives (urticaria) are itchy, red, swollen areas on your skin. Hives can appear on any part of your body and can vary in size. They can be as small as the tip of a pen or much larger. Hives often fade within 24 hours (acute hives). In other cases, new hives appear after old ones fade. This cycle can continue for several days or weeks (chronic hives). Hives result from your body's reaction to an irritant or to something that you are allergic to (trigger). When you are exposed to a trigger, your body releases a chemical (histamine) that causes redness, itching, and swelling. You can get hives immediately after being exposed to a trigger or hours later. Hives do not spread from person to person (are not contagious). Your hives may get worse with scratching, exercise, and emotional stress. What are the causes? Causes of this condition include:  Allergies to certain foods or ingredients.  Insect bites or stings.  Exposure to pollen or pet dander.  Contact with latex or chemicals.  Spending time in sunlight, heat, or cold (exposure).  Exercise.  Stress.  You can also get hives from some medical conditions and treatments. These  include:  Viruses, including the common cold.  Bacterial infections, such as urinary tract infections and strep throat.  Disorders such as vasculitis, lupus, or thyroid disease.  Certain medications.  Allergy shots.  Blood transfusions.  Sometimes, the cause of hives is not known (idiopathic hives). What increases the risk? This condition is more likely to develop in:  Women.  People who have food allergies, especially to citrus fruits, milk, eggs, peanuts, tree nuts, or shellfish.  People who are allergic to: ? Medicines. ? Latex. ? Insects. ? Animals. ? Pollen.  People who have certain medical conditions, includinglupus or thyroid disease.  What are the signs or symptoms? The main symptom of this condition is raised, itchyred or white bumps or patches on your skin. These areas may:  Become large and swollen (welts).  Change in shape and location, quickly and repeatedly.  Be separate hives or connect over a large area of skin.  Sting or become painful.  Turn white when pressed in the center (blanch).  In severe cases, yourhands, feet, and face may also become swollen. This may occur if hives develop deeper in your skin. How is this diagnosed? This condition is diagnosed based on your symptoms, medical history, and physical exam. Your skin, urine, or blood may be tested to find out what is causing your hives and to rule out other health issues. Your health care provider may also remove a small sample of skin from the affected area and examine it under a microscope (biopsy). How is this treated? Treatment  depends on the severity of your condition. Your health care provider may recommend using cool, wet cloths (cool compresses) or taking cool showers to relieve itching. Hives are sometimes treated with medicines, including:  Antihistamines.  Corticosteroids.  Antibiotics.  An injectable medicine (omalizumab). Your health care provider may prescribe this if you  have chronic idiopathic hives and you continue to have symptoms even after treatment with antihistamines.  Severe cases may require an emergency injection of adrenaline (epinephrine) to prevent a life-threatening allergic reaction (anaphylaxis). Follow these instructions at home: Medicines  Take or apply over-the-counter and prescription medicines only as told by your health care provider.  If you were prescribed an antibiotic medicine, use it as told by your health care provider. Do not stop taking the antibiotic even if you start to feel better. Skin Care  Apply cool compresses to the affected areas.  Do not scratch or rub your skin. General instructions  Do not take hot showers or baths. This can make itching worse.  Do not wear tight-fitting clothing.  Use sunscreen and wear protective clothing when you are outside.  Avoid any substances that cause your hives. Keep a journal to help you track what causes your hives. Write down: ? What medicines you take. ? What you eat and drink. ? What products you use on your skin.  Keep all follow-up visits as told by your health care provider. This is important. Contact a health care provider if:  Your symptoms are not controlled with medicine.  Your joints are painful or swollen. Get help right away if:  You have a fever.  You have pain in your abdomen.  Your tongue or lips are swollen.  Your eyelids are swollen.  Your chest or throat feels tight.  You have trouble breathing or swallowing. These symptoms may represent a serious problem that is an emergency. Do not wait to see if the symptoms will go away. Get medical help right away. Call your local emergency services (911 in the [place] Do not drive yourself to the hospital. This information is not intended to replace advice given to you by your health care provider. Make sure you discuss any questions you have with your health care provider. Document Released: 10/07/2005  Document Revised: 03/06/2016 Document Reviewed: 07/26/2015 Elsevier Interactive Patient Education  2017 [employer].

## 2017-07-02 NOTE — Progress Notes (Addendum)
Chief Complaint  Patient presents with  . Medication Refill    levocetirizine and ativan  . face itching    x couple days, really bad on yesterday, ? allergies    HPI   Pt is taking xyzal, singulair and flonase  She reports that she has been having facial itching especially around the nose, mouth and forehead She states that she has always had this but reports that it is intensified in the past few days She reports that today she feels okay  She reports that she takes ativan for long car rides and plane rides She does not take it for anxiety but more for phobias related to travel    Past Medical History:  Diagnosis Date  . Blood type, Rh negative   . History of chicken pox   . PID (pelvic inflammatory disease)   . Pyloric stenosis     Current Outpatient Prescriptions  Medication Sig Dispense Refill  . famotidine-calcium carbonate-magnesium hydroxide (PEPCID COMPLETE) [phone] MG chewable tablet Chew 1 tablet by mouth daily.    . fluticasone (FLONASE) 50 MCG/ACT nasal spray Place 1 spray into both nostrils daily.    Marland Kitchen ibuprofen (ADVIL,MOTRIN) 200 MG tablet Take 400 mg by mouth every 6 (six) hours as needed for moderate pain.    Marland Kitchen levocetirizine (XYZAL) 5 MG tablet Take 1 tablet (5 mg total) by mouth every evening. (Patient taking differently: Take 5 mg by mouth every morning. ) 30 tablet 5  . LORazepam (ATIVAN) 0.5 MG tablet Take 1 tablet (0.5 mg total) by mouth at bedtime as needed. 10 tablet 1  . methadone (DOLOPHINE) 10 MG/ML solution Take 170 mg by mouth daily.    . montelukast (SINGULAIR) 10 MG tablet TAKE 1 TABLET (10 MG TOTAL) BY MOUTH AT BEDTIME. 30 tablet 0  . chlorhexidine (HIBICLENS) 4 % external liquid Apply topically daily as needed. (Patient not taking: Reported on 02/21/2017) 120 mL 0  . doxycycline (VIBRA-TABS) 100 MG tablet Take 1 tablet (100 mg total) by mouth 2 (two) times daily. (Patient not taking: Reported on 02/21/2017) 20 tablet 0  . fluconazole  (DIFLUCAN) 150 MG tablet Take 1 tablet (150 mg total) by mouth daily. Repeat in 3 days for yeast. (Patient not taking: Reported on 07/02/2017) 2 tablet 0  . ondansetron (ZOFRAN) 4 MG tablet Take 1 tablet (4 mg total) by mouth every 8 (eight) hours as needed for nausea or vomiting. (Patient not taking: Reported on 02/21/2017) 20 tablet 0  . promethazine (PHENERGAN) 25 MG tablet Take 1 tablet (25 mg total) by mouth every 6 (six) hours as needed for nausea or vomiting. (Patient not taking: Reported on 07/02/2017) 12 tablet 0   No current facility-administered medications for this visit.     Allergies: No Known Allergies  Past Surgical History:  Procedure Laterality Date  . APPENDECTOMY    . calf augmentation    . CESAREAN SECTION    . club foot correction    . DILATION AND CURETTAGE OF UTERUS    . HERNIA REPAIR    . RHINOPLASTY      Social History   Social History  . Marital status: Married    Spouse name: N/A  . Number of children: N/A  . Years of education: N/A   Social History Main Topics  . Smoking status: Never Smoker  . Smokeless tobacco: Never Used  . Alcohol use Yes  . Drug use: No  . Sexual activity: Yes    Birth control/ protection: IUD  Comment: mirena   Other Topics Concern  . None   Social History Narrative  . None    ROS  Objective: Vitals:   07/02/17 1015 07/02/17 1042  BP: (!) 157/96 134/85  Pulse: 73 68  Resp: 16   Temp: 98.3 F (36.8 C)   TempSrc: Oral   SpO2: 98%   Weight: 160 lb 6.4 oz (72.8 kg)   Height: 5\' 4"  (1.626 m)     Physical Exam  Constitutional: She appears well-developed and well-nourished.  HENT:  Head: Normocephalic and atraumatic.  Pulmonary/Chest: Effort normal.  Skin: Skin is warm.  No visible rash noted but mild erythema and dry skin around the nose and mouth  Psychiatric: She has a normal mood and affect. Her behavior is normal. Judgment and thought content normal.      Assessment and Plan Lourie was seen today  for medication refill and face itching.  Diagnoses and all orders for this visit:  Need for prophylactic vaccination and inoculation against influenza -     Flu Vaccine QUAD 36+ mos IM  Hives- continue allergy meds, pt has triamcinolone at home prescribed by gynecology for her genital eczema  Fear of flying- refilled ativan -     LORazepam (ATIVAN) 0.5 MG tablet; Take 1 tablet (0.5 mg total) by mouth at bedtime as needed.     [place]

## 2017-09-24 ENCOUNTER — Telehealth (HOSPITAL_COMMUNITY): Payer: Self-pay | Admitting: Psychology

## 2017-09-26 ENCOUNTER — Encounter (INDEPENDENT_AMBULATORY_CARE_PROVIDER_SITE_OTHER): Payer: Self-pay

## 2017-09-26 ENCOUNTER — Encounter (HOSPITAL_COMMUNITY): Payer: Self-pay | Admitting: Psychology

## 2017-09-26 ENCOUNTER — Ambulatory Visit (INDEPENDENT_AMBULATORY_CARE_PROVIDER_SITE_OTHER): Payer: BLUE CROSS/BLUE SHIELD | Admitting: Psychology

## 2017-09-26 DIAGNOSIS — F1020 Alcohol dependence, uncomplicated: Secondary | ICD-10-CM

## 2017-09-26 DIAGNOSIS — F1120 Opioid dependence, uncomplicated: Secondary | ICD-10-CM
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
## 2017-09-30 ENCOUNTER — Telehealth (HOSPITAL_COMMUNITY): Payer: Self-pay | Admitting: Psychology

## 2017-10-01 ENCOUNTER — Ambulatory Visit (INDEPENDENT_AMBULATORY_CARE_PROVIDER_SITE_OTHER): Payer: BLUE CROSS/BLUE SHIELD | Admitting: Medical

## 2017-10-01 ENCOUNTER — Encounter (HOSPITAL_COMMUNITY): Payer: Self-pay | Admitting: Medical

## 2017-10-01 VITALS — BP 140/84 | HR 100 | Ht 64.5 in | Wt 151.0 lb

## 2017-10-01 DIAGNOSIS — F17200 Nicotine dependence, unspecified, uncomplicated: Secondary | ICD-10-CM | POA: Diagnosis not present

## 2017-10-01 DIAGNOSIS — N3010 Interstitial cystitis (chronic) without hematuria: Secondary | ICD-10-CM

## 2017-10-01 DIAGNOSIS — F1120 Opioid dependence, uncomplicated: Secondary | ICD-10-CM

## 2017-10-01 DIAGNOSIS — Z8659 Personal history of other mental and behavioral disorders: Secondary | ICD-10-CM

## 2017-10-01 DIAGNOSIS — F909 Attention-deficit hyperactivity disorder, unspecified type: Secondary | ICD-10-CM | POA: Diagnosis not present

## 2017-10-01 DIAGNOSIS — F418 Other specified anxiety disorders: Secondary | ICD-10-CM

## 2017-10-01 DIAGNOSIS — Z639 Problem related to primary support group, unspecified: Secondary | ICD-10-CM

## 2017-10-01 DIAGNOSIS — F40243 Fear of flying: Secondary | ICD-10-CM

## 2017-10-01 DIAGNOSIS — F1994 Other psychoactive substance use, unspecified with psychoactive substance-induced mood disorder: Secondary | ICD-10-CM | POA: Diagnosis not present

## 2017-10-01 DIAGNOSIS — F19980 Other psychoactive substance use, unspecified with psychoactive substance-induced anxiety disorder: Secondary | ICD-10-CM | POA: Diagnosis not present

## 2017-10-01 DIAGNOSIS — F1020 Alcohol dependence, uncomplicated: Secondary | ICD-10-CM | POA: Diagnosis not present
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
## 2017-10-03 ENCOUNTER — Encounter (HOSPITAL_COMMUNITY): Payer: Self-pay | Admitting: Medical

## 2017-10-06 ENCOUNTER — Encounter (HOSPITAL_COMMUNITY): Payer: Self-pay | Admitting: Psychology

## 2017-10-07 ENCOUNTER — Telehealth (HOSPITAL_COMMUNITY): Payer: Self-pay | Admitting: Psychology

## 2017-10-07 NOTE — Progress Notes (Signed)
Patient ID: Jacqueline Jimenez, female   DOB: 22-Jun-1976, 41 y.o.   MRN: [id number] CD-IOP. Met with the patient this afternoon as scheduled. We had planned on completing her orientation and she would begin the CD-IOP tomorrow. However, the patient became tearful and admitted she felt as if she needed to return to inpatient. The patient reported she feels a lot of pressure on her chest and wonders if she needs to complete another alcohol detox. We agreed that she should contact the [facility] and discuss returning there. She made the call from my phone and explained the situation. The assessment  counselor was very understanding and collected the pertinent information. She did not believe there would be a problem with being approved to return since she had been a very compliant resident during her recent stay. The counselor reported she would contact her after speaking with the intake specialists and contact her later this evening. I agreed to phone the patient later tomorrow morning to learn her decision. I encouraged her to begin contacting [facility] about two weeks before her scheduled discharge and also to phone [facility] outpatient and set up an appointment with Wes or myself immediately upon her return to [facility]. We will talk again tomorrow, but based on the fact that the patient has been drinking and likely underreporting, she can return and enter the program with 30 days of total sobriety (not counting the daily methadone dose that she will be receiving). The patient stated that she also wanted to began reducing her dose gradually while in residential treatment.

## 2017-11-24 ENCOUNTER — Ambulatory Visit (INDEPENDENT_AMBULATORY_CARE_PROVIDER_SITE_OTHER): Payer: BLUE CROSS/BLUE SHIELD

## 2017-11-24 ENCOUNTER — Other Ambulatory Visit: Payer: Self-pay

## 2017-11-24 ENCOUNTER — Ambulatory Visit: Payer: BLUE CROSS/BLUE SHIELD | Admitting: Family Medicine

## 2017-11-24 ENCOUNTER — Encounter: Payer: Self-pay | Admitting: Family Medicine

## 2017-11-24 VITALS — BP 106/64 | HR 95 | Temp 99.0°F | Resp 16 | Ht 64.5 in | Wt 152.8 lb

## 2017-11-24 DIAGNOSIS — S0990XA Unspecified injury of head, initial encounter: Secondary | ICD-10-CM | POA: Diagnosis not present

## 2017-11-24 DIAGNOSIS — T7491XA Unspecified adult maltreatment, confirmed, initial encounter: Secondary | ICD-10-CM

## 2017-11-24 DIAGNOSIS — Z113 Encounter for screening for infections with a predominantly sexual mode of transmission: Secondary | ICD-10-CM

## 2017-11-24 DIAGNOSIS — F5101 Primary insomnia: Secondary | ICD-10-CM | POA: Diagnosis not present

## 2017-11-24 DIAGNOSIS — F40243 Fear of flying: Secondary | ICD-10-CM | POA: Diagnosis not present

## 2017-11-24 MED ORDER — LORAZEPAM 0.5 MG PO TABS: 0.5000 mg | ORAL_TABLET | Freq: Every evening | ORAL | 0 refills | Status: DC | PRN

## 2017-11-24 NOTE — Patient Instructions (Signed)
     IF you received an x-ray today, you will receive an invoice from [facility]. Please contact [facility] at [phone] with questions or concerns regarding your invoice.   IF you received labwork today, you will receive an invoice from [facility]. Please contact [facility] at [phone] with questions or concerns regarding your invoice.   Our billing staff will not be able to assist you with questions regarding bills from these companies.  You will be contacted with the lab results as soon as they are available. The fastest way to get your results is to activate your My Chart account. Instructions are located on the last page of this paperwork. If you have not heard from us regarding the results in 2 weeks, please contact this office.     
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
## 2017-11-25 LAB — GC/CHLAMYDIA PROBE AMP
CHLAMYDIA, DNA PROBE: NEGATIVE
NEISSERIA GONORRHOEAE BY PCR: NEGATIVE

## 2017-11-25 LAB — HEPATITIS B SURFACE ANTIGEN: Hepatitis B Surface Ag: NEGATIVE

## 2017-11-25 LAB — HIV ANTIBODY (ROUTINE TESTING W REFLEX): HIV SCREEN 4TH GENERATION: NONREACTIVE

## 2017-11-25 LAB — RPR: RPR: NONREACTIVE

## 2017-11-29 DIAGNOSIS — T7491XA Unspecified adult maltreatment, confirmed, initial encounter: Secondary | ICD-10-CM | POA: Insufficient documentation

## 2018-01-26 ENCOUNTER — Encounter (HOSPITAL_COMMUNITY): Payer: Self-pay | Admitting: *Deleted

## 2018-01-26 ENCOUNTER — Emergency Department (HOSPITAL_COMMUNITY)
Admission: EM | Admit: 2018-01-26 | Discharge: 2018-01-27 | Disposition: A | Discharge status: Home / Self Care | Payer: BLUE CROSS/BLUE SHIELD | Attending: Emergency Medicine | Admitting: Emergency Medicine

## 2018-01-26 ENCOUNTER — Ambulatory Visit: Payer: Self-pay | Admitting: *Deleted

## 2018-01-26 DIAGNOSIS — B9689 Other specified bacterial agents as the cause of diseases classified elsewhere: Secondary | ICD-10-CM | POA: Diagnosis not present

## 2018-01-26 DIAGNOSIS — Z79899 Other long term (current) drug therapy: Secondary | ICD-10-CM | POA: Insufficient documentation

## 2018-01-26 DIAGNOSIS — F17200 Nicotine dependence, unspecified, uncomplicated: Secondary | ICD-10-CM | POA: Insufficient documentation

## 2018-01-26 DIAGNOSIS — N760 Acute vaginitis: Secondary | ICD-10-CM

## 2018-01-26 DIAGNOSIS — N898 Other specified noninflammatory disorders of vagina: Secondary | ICD-10-CM | POA: Diagnosis not present

## 2018-01-26 LAB — COMPREHENSIVE METABOLIC PANEL
ALBUMIN: 4.2 g/dL (ref 3.5–5.0)
ALT: 12 U/L — ABNORMAL LOW (ref 14–54)
AST: 16 U/L (ref 15–41)
Alkaline Phosphatase: 50 U/L (ref 38–126)
Anion gap: 15 (ref 5–15)
BUN: 12 mg/dL (ref 6–20)
CHLORIDE: 103 mmol/L (ref 101–111)
CO2: 23 mmol/L (ref 22–32)
Calcium: 9.1 mg/dL (ref 8.9–10.3)
Creatinine, Ser: 0.63 mg/dL (ref 0.44–1.00)
GFR calc Af Amer: >60 mL/min (ref >60)
GFR calc non Af Amer: >60 mL/min (ref >60)
GLUCOSE: 124 mg/dL — AB (ref 65–99)
Potassium: 3.4 mmol/L — ABNORMAL LOW (ref 3.5–5.1)
SODIUM: 141 mmol/L (ref 135–145)
Total Bilirubin: 0.2 mg/dL — ABNORMAL LOW (ref 0.3–1.2)
Total Protein: 7.4 g/dL (ref 6.5–8.1)

## 2018-01-26 LAB — URINALYSIS, ROUTINE W REFLEX MICROSCOPIC
Bacteria, UA: NONE SEEN
Bilirubin Urine: NEGATIVE
Glucose, UA: NEGATIVE mg/dL
Ketones, ur: 5 mg/dL — AB
Nitrite: NEGATIVE
Protein, ur: NEGATIVE mg/dL
SPECIFIC GRAVITY, URINE: 1.026 (ref 1.005–1.030)
pH: 5 (ref 5.0–8.0)

## 2018-01-26 LAB — CBC WITH DIFFERENTIAL/PLATELET
Basophils Absolute: 0.1 10*3/uL (ref 0.0–0.1)
Basophils Relative: 1 %
EOS PCT: 2 %
Eosinophils Absolute: 0.2 10*3/uL (ref 0.0–0.7)
HEMATOCRIT: 37 % (ref 36.0–46.0)
Hemoglobin: 12.9 g/dL (ref 12.0–15.0)
LYMPHS ABS: 3.6 10*3/uL (ref 0.7–4.0)
LYMPHS PCT: 42 %
MCH: 31.5 pg (ref 26.0–34.0)
MCHC: 34.9 g/dL (ref 30.0–36.0)
MCV: 90.2 fL (ref 78.0–100.0)
MONO ABS: 0.6 10*3/uL (ref 0.1–1.0)
Monocytes Relative: 7 %
NEUTROS ABS: 4.1 10*3/uL (ref 1.7–7.7)
Neutrophils Relative %: 48 %
Platelets: 294 10*3/uL (ref 150–400)
RBC: 4.1 MIL/uL (ref 3.87–5.11)
RDW: 13.4 % (ref 11.5–15.5)
WBC: 8.4 10*3/uL (ref 4.0–10.5)

## 2018-01-26 LAB — I-STAT BETA HCG BLOOD, ED (MC, WL, AP ONLY): I-stat hCG, quantitative: <5 m[IU]/mL (ref <5)

## 2018-01-26 LAB — WET PREP, GENITAL
Sperm: NONE SEEN
Trich, Wet Prep: NONE SEEN
Yeast Wet Prep HPF POC: NONE SEEN

## 2018-01-26 MED ORDER — METRONIDAZOLE 500 MG PO TABS: 500.0000 mg | ORAL_TABLET | Freq: Two times a day (BID) | ORAL | 0 refills | Status: DC
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
## 2018-01-26 NOTE — ED Triage Notes (Signed)
Pt believes she has a foreign body stuck in her vagina. Pt believes it may be a condom. Pt believes it has been there for 30 days. Pt has 3/10 vaginal pain, abdominal cramping. Pt has IUD. Pt reports foul vaginal odor.

## 2018-01-26 NOTE — Telephone Encounter (Signed)
Called in c/o feeling like there was a foreign body in her vaginal for the past week.   C/o having a fishy smell.    She stated it feels puffy on either side of her cervix  When she inserts her finger.   She has an IUD.   Does not feel it but can feel the 2 strings coming out that are attached to the IUD.    She did not want to go to the ED.   I was able to get her an appt with Tenna Delaine, PA-C for today at 4:40 at [place] at [facility] so she would not have to go to the ED.   She thanked me several times for that.  I instructed her to go to the ED if she developed pain or bleeding.   She verbalized understanding. Reason for Disposition . FB causes pain or discomfort  Answer Assessment - Initial Assessment Questions 1. OBJECT: "What do you think it is?"      I'm not sure.  I do have an IUD.  I can feel the 2 strings from IUD..   I feel like something is stuck inside my vagina. 2. SIZE: "How large is it?"      I feel puffiness by the vaginal  opening. 3. PAIN: "Are you having any pain?" If so, ask: "How bad is it?"  (Scale 1-10; or mild, moderate, severe)     During intercourse it hurts.    No abd pain. 4. ONSET: "How long do you think the foreign body has been there?"      A week.    Staying the same.   I had my period.   I used a tampon.   I smell fishy. 5. MECHANISM: "Tell me how it happened."     I'm was wondering if something got stuck in it like a condom or tampon. 6. OTHER SYMPTOMS: "Do you have any other symptoms?" (e.g., vaginal discharge, fever, rash)     No discharge but a fishy smell. 7. PREGNANCY: "Is there any chance you are pregnant?" "When was your last menstrual period?"     No.   I have an IUD.  Protocols used: VAGINAL FOREIGN BODY-A-AH

## 2018-01-27 LAB — GC/CHLAMYDIA PROBE AMP (~~LOC~~) NOT AT ARMC
CHLAMYDIA, DNA PROBE: NEGATIVE
Neisseria Gonorrhea: NEGATIVE

## 2018-01-27 LAB — RPR: RPR Ser Ql: NONREACTIVE

## 2018-01-27 LAB — HIV ANTIBODY (ROUTINE TESTING W REFLEX): HIV Screen 4th Generation wRfx: NONREACTIVE

## 2018-01-27 MED ORDER — METRONIDAZOLE 500 MG PO TABS: 500.0000 mg | ORAL_TABLET | Freq: Two times a day (BID) | ORAL | 0 refills | Status: DC

## 2018-02-02 ENCOUNTER — Ambulatory Visit: Payer: Self-pay | Admitting: Physician Assistant

## 2018-02-25 ENCOUNTER — Ambulatory Visit: Payer: BLUE CROSS/BLUE SHIELD | Admitting: Family Medicine

## 2018-02-25 NOTE — Progress Notes (Signed)
Chief Complaint  Patient presents with  . f/u medication management    pt requesting refills on prozac and flagyl- dx with bv at an UC and given hardcopy script and lost it.    HPI  Anxiety and Depression Patient reports that she has been out of the prozac for 2 weeks She states that she felt like her Prozac 40mg  which she ran out of She states that she was doing well with the prozac and going away to [facility] helped her She now reports that they are civil   Depression screen [facility] 2/9 02/26/2018 11/24/2017 11/24/2017 07/02/2017 02/04/2017  Decreased Interest 2 3 2  0 0  Down, Depressed, Hopeless 1 3 2  0 0  PHQ - 2 Score 3 6 4  0 0  Altered sleeping 3 3 3  - -  Tired, decreased energy 1 3 2  - -  Change in appetite 1 3 3  - -  Feeling bad or failure about yourself  1 3 2  - -  Trouble concentrating 0 2 - - -  Moving slowly or fidgety/restless 0 2 1 - -  Suicidal thoughts 0 0 0 - -  PHQ-9 Score 9 22 15  - -  Difficult doing work/chores Somewhat difficult Somewhat difficult - - -  Some encounter information is confidential and restricted. Go to Review Flowsheets activity to see all data.   Bacterial Vaginosis She went to the ER and was diagnosed with BV but lost the script She states that she has foul odor She continues to have foul odor since she did not take the medications Component     Latest Ref Rng & Units 01/26/2018  Yeast Wet Prep HPF POC     NONE SEEN NONE SEEN  Trich, Wet Prep     NONE SEEN NONE SEEN  Clue Cells Wet Prep HPF POC     NONE SEEN PRESENT (A)  WBC, Wet Prep HPF POC     NONE SEEN FEW (A)  Sperm      NONE SEEN      Past Medical History:  Diagnosis Date  . Blood type, Rh negative   . History of chicken pox   . PID (pelvic inflammatory disease)   . Pyloric stenosis     Current Outpatient Medications  Medication Sig Dispense Refill  . FLUoxetine (PROZAC) 40 MG capsule Take 1 capsule (40 mg total) by mouth daily. 90 capsule 1  . fluticasone (FLONASE) 50 MCG/ACT  nasal spray Place 1 spray into both nostrils daily.    Marland Kitchen ibuprofen (ADVIL,MOTRIN) 200 MG tablet Take 400 mg by mouth every 6 (six) hours as needed for moderate pain.    Marland Kitchen levocetirizine (XYZAL) 5 MG tablet Take 1 tablet (5 mg total) by mouth every evening. (Patient taking differently: Take 5 mg by mouth every morning. ) 30 tablet 5  . levonorgestrel (MIRENA, 52 MG,) 20 MCG/24HR IUD Mirena 20 mcg/24 hr (5 years) intrauterine device  Take 1 device by intrauterine route.    Marland Kitchen LORazepam (ATIVAN) 0.5 MG tablet Take 1 tablet (0.5 mg total) by mouth at bedtime as needed. 10 tablet 0  . methadone (DOLOPHINE) 10 MG/ML solution Take 110 mg by mouth daily.     . montelukast (SINGULAIR) 10 MG tablet TAKE 1 TABLET (10 MG TOTAL) BY MOUTH AT BEDTIME. 30 tablet 0  . metroNIDAZOLE (FLAGYL) 500 MG tablet Take 1 tablet (500 mg total) by mouth 2 (two) times daily. 14 tablet 0   No current facility-administered medications for this visit.  Allergies: No Known Allergies  Past Surgical History:  Procedure Laterality Date  . APPENDECTOMY    . calf augmentation    . CESAREAN SECTION    . club foot correction    . DILATION AND CURETTAGE OF UTERUS    . HERNIA REPAIR    . RHINOPLASTY      Social History   Socioeconomic History  . Marital status: Married    Spouse name: Not on file  . Number of children: Not on file  . Years of education: Not on file  . Highest education level: Not on file  Occupational History  . Not on file  Social Needs  . Financial resource strain: Not on file  . Food insecurity:    Worry: Not on file    Inability: Not on file  . Transportation needs:    Medical: Not on file    Non-medical: Not on file  Tobacco Use  . Smoking status: Current Some Day Smoker  . Smokeless tobacco: Never Used  Substance and Sexual Activity  . Alcohol use: Yes    Frequency: Never    Comment: Sober times 2 days   . Drug use: No  . Sexual activity: Yes    Partners: Male    Birth  control/protection: IUD    Comment: mirena  Lifestyle  . Physical activity:    Days per week: Not on file    Minutes per session: Not on file  . Stress: Not on file  Relationships  . Social connections:    Talks on phone: Not on file    Gets together: Not on file    Attends religious service: Not on file    Active member of club or organization: Not on file    Attends meetings of clubs or organizations: Not on file    Relationship status: Not on file  Other Topics Concern  . Not on file  Social History Narrative  . Not on file    Family History  Problem Relation Age of Onset  . Diabetes Maternal Grandmother   . Cancer Maternal Grandmother   . Breast cancer Maternal Grandmother 74  . Hypertension Father   . Cancer Mother   . Breast cancer Mother 65       survivor  . Breast cancer Other        unknown age and pased away     ROS Review of Systems See HPI Constitution: No fevers or chills No malaise No diaphoresis Skin: No rash or itching Eyes: no blurry vision, no double vision GU: no dysuria or hematuria Neuro: no dizziness or headaches  all others reviewed and negative   Objective: Vitals:   02/26/18 1415 02/26/18 1442  BP: (!) 150/94 124/70  Pulse: 89   Resp: 17   Temp: 99.3 F (37.4 C)   TempSrc: Oral   SpO2: 98%   Weight: 160 lb 6.4 oz (72.8 kg)   Height: 5' 4.5" (1.638 m)     Physical Exam  Constitutional: She is oriented to person, place, and time. She appears well-developed and well-nourished.  HENT:  Head: Normocephalic and atraumatic.  Eyes: Conjunctivae and EOM are normal.  Cardiovascular: Normal rate and regular rhythm.  No murmur heard. Pulmonary/Chest: Effort normal and breath sounds normal. No stridor. No respiratory distress.  Neurological: She is alert and oriented to person, place, and time.  Psychiatric: She has a normal mood and affect. Her behavior is normal. Judgment and thought content normal.     Assessment  and Plan Jacqueline Jimenez  was seen today for f/u medication management.  Diagnoses and all orders for this visit:  Anxious depression Primary insomnia Refilled medication  prozac helped mood and rest  -     FLUoxetine (PROZAC) 40 MG capsule; Take 1 capsule (40 mg total) by mouth daily.  BV (bacterial vaginosis)-  -     metroNIDAZOLE (FLAGYL) 500 MG tablet; Take 1 tablet (500 mg total) by mouth 2 (two) times daily. Sent in medication since pt did not take it       [place]

## 2018-02-26 ENCOUNTER — Encounter: Payer: Self-pay | Admitting: Family Medicine

## 2018-02-26 ENCOUNTER — Ambulatory Visit: Payer: BLUE CROSS/BLUE SHIELD | Admitting: Family Medicine

## 2018-02-26 ENCOUNTER — Other Ambulatory Visit: Payer: Self-pay

## 2018-02-26 VITALS — BP 124/70 | HR 89 | Temp 99.3°F | Resp 17 | Ht 64.5 in | Wt 160.4 lb

## 2018-02-26 DIAGNOSIS — N760 Acute vaginitis: Secondary | ICD-10-CM | POA: Diagnosis not present

## 2018-02-26 DIAGNOSIS — F5101 Primary insomnia: Secondary | ICD-10-CM | POA: Diagnosis not present

## 2018-02-26 DIAGNOSIS — F418 Other specified anxiety disorders: Secondary | ICD-10-CM

## 2018-02-26 DIAGNOSIS — B9689 Other specified bacterial agents as the cause of diseases classified elsewhere: Secondary | ICD-10-CM

## 2018-02-26 MED ORDER — FLUOXETINE HCL 40 MG PO CAPS: 40.0000 mg | ORAL_CAPSULE | Freq: Every day | ORAL | 1 refills | Status: DC

## 2018-02-26 MED ORDER — METRONIDAZOLE 500 MG PO TABS: 500.0000 mg | ORAL_TABLET | Freq: Two times a day (BID) | ORAL | 0 refills | Status: DC

## 2018-02-26 NOTE — Patient Instructions (Addendum)
Antibiotic - You have received the antibiotic Metronidazole. It may take several days before you feel any benefit from this medicine. Be sure to take all the medication prescribed, even if you are feeling better before it is finished. Do not drink alcohol while taking antibiotics.  It is unknown whether you will develop an adverse reaction (diarrhea, rash, nausea or vomiting) or an allergic reaction (trouble breathing, anaphylaxis).    For Females on birth control medications: Please use back up birth control (condoms) as some antibiotics can decrease the effectiveness of birth control and increase the chance of pregnancy.      IF you received an x-ray today, you will receive an invoice from [facility]. Please contact [facility] at [phone] with questions or concerns regarding your invoice.   IF you received labwork today, you will receive an invoice from [place]. Please contact [facility] at [phone] with questions or concerns regarding your invoice.   Our billing staff will not be able to assist you with questions regarding bills from these companies.  You will be contacted with the lab results as soon as they are available. The fastest way to get your results is to activate your My Chart account. Instructions are located on the last page of this paperwork. If you have not heard from [place] regarding the results in 2 weeks, please contact this office.     Bacterial Vaginosis Bacterial vaginosis is a vaginal infection that occurs when the normal balance of bacteria in the vagina is disrupted. It results from an overgrowth of certain bacteria. This is the most common vaginal infection among women ages 86-44. Because bacterial vaginosis increases your risk for STIs (sexually transmitted infections), getting treated can help reduce your risk for chlamydia, gonorrhea, herpes, and HIV (human immunodeficiency virus). Treatment is also important for preventing complications in  pregnant women, because this condition can cause an early (premature) delivery. What are the causes? This condition is caused by an increase in harmful bacteria that are normally present in small amounts in the vagina. However, the reason that the condition develops is not fully understood. What increases the risk? The following factors may make you more likely to develop this condition:  Having a new sexual partner or multiple sexual partners.  Having unprotected sex.  Douching.  Having an intrauterine device (IUD).  Smoking.  Drug and alcohol abuse.  Taking certain antibiotic medicines.  Being pregnant.  You cannot get bacterial vaginosis from toilet seats, bedding, swimming pools, or contact with objects around you. What are the signs or symptoms? Symptoms of this condition include:  Grey or white vaginal discharge. The discharge can also be watery or foamy.  A fish-like odor with discharge, especially after sexual intercourse or during menstruation.  Itching in and around the vagina.  Burning or pain with urination.  Some women with bacterial vaginosis have no signs or symptoms. How is this diagnosed? This condition is diagnosed based on:  Your medical history.  A physical exam of the vagina.  Testing a sample of vaginal fluid under a microscope to look for a large amount of bad bacteria or abnormal cells. Your health care provider may use a cotton swab or a small wooden spatula to collect the sample.  How is this treated? This condition is treated with antibiotics. These may be given as a pill, a vaginal cream, or a medicine that is put into the vagina (suppository). If the condition comes back after treatment, a second round of antibiotics may be needed.  Follow these instructions at home: Medicines  Take over-the-counter and prescription medicines only as told by your health care provider.  Take or use your antibiotic as told by your health care provider. Do  not stop taking or using the antibiotic even if you start to feel better. General instructions  If you have a female sexual partner, tell her that you have a vaginal infection. She should see her health care provider and be treated if she has symptoms. If you have a female sexual partner, he does not need treatment.  During treatment: ? Avoid sexual activity until you finish treatment. ? Do not douche. ? Avoid alcohol as directed by your health care provider. ? Avoid breastfeeding as directed by your health care provider.  Drink enough water and fluids to keep your urine clear or pale yellow.  Keep the area around your vagina and rectum clean. ? Wash the area daily with warm water. ? Wipe yourself from front to back after using the toilet.  Keep all follow-up visits as told by your health care provider. This is important. How is this prevented?  Do not douche.  Wash the outside of your vagina with warm water only.  Use protection when having sex. This includes latex condoms and dental dams.  Limit how many sexual partners you have. To help prevent bacterial vaginosis, it is best to have sex with just one partner (monogamous).  Make sure you and your sexual partner are tested for STIs.  Wear cotton or cotton-lined underwear.  Avoid wearing tight pants and pantyhose, especially during summer.  Limit the amount of alcohol that you drink.  Do not use any products that contain nicotine or tobacco, such as cigarettes and e-cigarettes. If you need help quitting, ask your health care provider.  Do not use illegal drugs. Where to find more information:  Centers for Disease Control and Prevention: AppraiserFraud.fi  American Sexual Health Association (ASHA): www.ashastd.org  U.S. Department of Health and Financial controller, Office on Women's Health: DustingSprays.pl or SecuritiesCard.it Contact a health care provider if:  Your symptoms do  not improve, even after treatment.  You have more discharge or pain when urinating.  You have a fever.  You have pain in your abdomen.  You have pain during sex.  You have vaginal bleeding between periods. Summary  Bacterial vaginosis is a vaginal infection that occurs when the normal balance of bacteria in the vagina is disrupted.  Because bacterial vaginosis increases your risk for STIs (sexually transmitted infections), getting treated can help reduce your risk for chlamydia, gonorrhea, herpes, and HIV (human immunodeficiency virus). Treatment is also important for preventing complications in pregnant women, because the condition can cause an early (premature) delivery.  This condition is treated with antibiotic medicines. These may be given as a pill, a vaginal cream, or a medicine that is put into the vagina (suppository). This information is not intended to replace advice given to you by your health care provider. Make sure you discuss any questions you have with your health care provider. Document Released: 10/07/2005 Document Revised: 02/10/2017 Document Reviewed: 06/22/2016 Elsevier Interactive Patient Education  [employer].

## 2018-05-21 NOTE — Congregational Nurse Program (Signed)
Congregational Nurse Program Note  Date of Encounter: 05/06/2018  Past Medical History: Past Medical History:  Diagnosis Date  . Blood type, Rh negative   . History of chicken pox   . PID (pelvic inflammatory disease)   . Pyloric stenosis     Encounter Details: CNP Questionnaire - 05/06/18 1224      Questionnaire   Patient Status  Not Applicable    Race  White or Caucasian    Location Patient Served At  [facility]    Uninsured  Not Applicable    Food  No food insecurities    Housing/Utilities  Yes, have permanent housing    Transportation  Yes, need transportation assistance    Interpersonal Safety  Yes, feel physically and emotionally safe where you currently live    Medication  No medication insecurities    Medical Provider  Yes    Referrals  Not Applicable    ED Visit Averted  Not Applicable    Life-Saving Intervention Made  Not Applicable      no health concerns this visit

## 2018-05-27 DIAGNOSIS — L97521 Non-pressure chronic ulcer of other part of left foot limited to breakdown of skin: Secondary | ICD-10-CM | POA: Diagnosis not present

## 2018-05-27 DIAGNOSIS — M79672 Pain in left foot: Secondary | ICD-10-CM | POA: Diagnosis not present

## 2018-05-27 DIAGNOSIS — M722 Plantar fascial fibromatosis: Secondary | ICD-10-CM | POA: Diagnosis not present

## 2018-06-29 ENCOUNTER — Encounter: Payer: Self-pay | Admitting: Family Medicine

## 2018-06-29 ENCOUNTER — Other Ambulatory Visit: Payer: Self-pay | Admitting: *Deleted

## 2018-06-29 ENCOUNTER — Ambulatory Visit: Payer: BLUE CROSS/BLUE SHIELD

## 2018-06-29 ENCOUNTER — Ambulatory Visit (INDEPENDENT_AMBULATORY_CARE_PROVIDER_SITE_OTHER): Payer: BLUE CROSS/BLUE SHIELD | Admitting: Family Medicine

## 2018-06-29 VITALS — BP 118/74 | HR 80 | Temp 98.6°F | Resp 16 | Ht 64.0 in | Wt 155.8 lb

## 2018-06-29 DIAGNOSIS — R87610 Atypical squamous cells of undetermined significance on cytologic smear of cervix (ASC-US): Secondary | ICD-10-CM | POA: Diagnosis not present

## 2018-06-29 DIAGNOSIS — N912 Amenorrhea, unspecified: Secondary | ICD-10-CM | POA: Diagnosis not present

## 2018-06-29 DIAGNOSIS — Z113 Encounter for screening for infections with a predominantly sexual mode of transmission: Secondary | ICD-10-CM

## 2018-06-29 DIAGNOSIS — Z30432 Encounter for removal of intrauterine contraceptive device: Secondary | ICD-10-CM

## 2018-06-29 DIAGNOSIS — Z23 Encounter for immunization: Secondary | ICD-10-CM

## 2018-06-29 DIAGNOSIS — Z803 Family history of malignant neoplasm of breast: Secondary | ICD-10-CM

## 2018-06-29 DIAGNOSIS — Z124 Encounter for screening for malignant neoplasm of cervix: Secondary | ICD-10-CM

## 2018-06-29 NOTE — Patient Instructions (Signed)
° ° ° °  If you have lab work done today you will be contacted with your lab results within the next 2 weeks.  If you have not heard from us then please contact us. The fastest way to get your results is to register for My Chart. ° ° °IF you received an x-ray today, you will receive an invoice from [facility]. Please contact [facility] at [phone] with questions or concerns regarding your invoice.  ° °IF you received labwork today, you will receive an invoice from [facility]. Please contact [facility] at [phone] with questions or concerns regarding your invoice.  ° °Our billing staff will not be able to assist you with questions regarding bills from these companies. ° °You will be contacted with the lab results as soon as they are available. The fastest way to get your results is to activate your My Chart account. Instructions are located on the last page of this paperwork. If you have not heard from us regarding the results in 2 weeks, please contact this office. °  ° ° ° °

## 2018-06-29 NOTE — Progress Notes (Signed)
Chief Complaint  Patient presents with  . removal of iud    HPI  Contraception Management Pt here to request IUD removal She had her IUD placed in 2016 She is a [id number] She is currently abstinent and does not desire pregnancy She would like to have the IUD out so that she does not have any additional hormones in her body No LMP recorded. (Menstrual status: IUD).  Family History of breast cancer  She has been getting mammograms ince age 42 due to her strong history of breast cancer in her family. She is BRCA negative. Her mammograms are up to date.  Cervical Cancer Screening Her last pap smear was 2016 She denies any abnormal pap smear in the past   Past Medical History:  Diagnosis Date  . Blood type, Rh negative   . History of chicken pox   . PID (pelvic inflammatory disease)   . Pyloric stenosis     Current Outpatient Medications  Medication Sig Dispense Refill  . FLUoxetine (PROZAC) 40 MG capsule Take 1 capsule (40 mg total) by mouth daily. 90 capsule 1  . fluticasone (FLONASE) 50 MCG/ACT nasal spray Place 1 spray into both nostrils daily.    Marland Kitchen ibuprofen (ADVIL,MOTRIN) 200 MG tablet Take 400 mg by mouth every 6 (six) hours as needed for moderate pain.    Marland Kitchen levocetirizine (XYZAL) 5 MG tablet Take 1 tablet (5 mg total) by mouth every evening. (Patient taking differently: Take 5 mg by mouth every morning. ) 30 tablet 5  . levonorgestrel (MIRENA, 52 MG,) 20 MCG/24HR IUD Mirena 20 mcg/24 hr (5 years) intrauterine device  Take 1 device by intrauterine route.    Marland Kitchen LORazepam (ATIVAN) 0.5 MG tablet Take 1 tablet (0.5 mg total) by mouth at bedtime as needed. 10 tablet 0  . methadone (DOLOPHINE) 10 MG/ML solution Take 110 mg by mouth daily.     . montelukast (SINGULAIR) 10 MG tablet TAKE 1 TABLET (10 MG TOTAL) BY MOUTH AT BEDTIME. 30 tablet 0   No current facility-administered medications for this visit.     Allergies: No Known Allergies  Past Surgical History:  Procedure  Laterality Date  . APPENDECTOMY    . calf augmentation    . CESAREAN SECTION    . club foot correction    . DILATION AND CURETTAGE OF UTERUS    . HERNIA REPAIR    . RHINOPLASTY      Social History   Socioeconomic History  . Marital status: Married    Spouse name: Not on file  . Number of children: Not on file  . Years of education: Not on file  . Highest education level: Not on file  Occupational History  . Not on file  Social Needs  . Financial resource strain: Not on file  . Food insecurity:    Worry: Not on file    Inability: Not on file  . Transportation needs:    Medical: Not on file    Non-medical: Not on file  Tobacco Use  . Smoking status: Current Some Day Smoker  . Smokeless tobacco: Never Used  Substance and Sexual Activity  . Alcohol use: Yes    Frequency: Never    Comment: Sober times 2 days   . Drug use: No  . Sexual activity: Yes    Partners: Male    Birth control/protection: IUD    Comment: mirena  Lifestyle  . Physical activity:    Days per week: Not on file  Minutes per session: Not on file  . Stress: Not on file  Relationships  . Social connections:    Talks on phone: Not on file    Gets together: Not on file    Attends religious service: Not on file    Active member of club or organization: Not on file    Attends meetings of clubs or organizations: Not on file    Relationship status: Not on file  Other Topics Concern  . Not on file  Social History Narrative  . Not on file    Family History  Problem Relation Age of Onset  . Diabetes Maternal Grandmother   . Cancer Maternal Grandmother   . Breast cancer Maternal Grandmother 62  . Hypertension Father   . Cancer Mother   . Breast cancer Mother 69       survivor  . Breast cancer Other        unknown age and pased away     ROS Review of Systems See HPI Constitution: No fevers or chills No malaise No diaphoresis Skin: No rash or itching Eyes: no blurry vision, no double  vision GU: no dysuria or hematuria Neuro: no dizziness or headaches all others reviewed and negative   Objective: Vitals:   06/29/18 0836  BP: 118/74  Pulse: 80  Resp: 16  Temp: 98.6 F (37 C)  TempSrc: Oral  SpO2: 97%  Weight: 155 lb 12.8 oz (70.7 kg)  Height: _0  (1.626 m)    Physical Exam  Constitutional: She appears well-developed and well-nourished.  HENT:  Head: Normocephalic and atraumatic.  Eyes: Conjunctivae and EOM are normal.  Pulmonary/Chest: Effort normal.  Psychiatric: She has a normal mood and affect. Her behavior is normal. Judgment and thought content normal.    Chaperone present External Genitalia within normal limits No bartholin cyst Normal labia majora, minor and urethral meatus Speculum exam reveals brownish discharge from the cervical os that looks like old blood IUD strings visualized. Pap smear performed. Bimanual exam normal without palpable masses.  IUD removal procedure note IUD removal explained to the patient who gave verbal consent. Explained that she can become immediately pregnant. Discussed other options for contraception.  IUD removed by traction on the string with ring forceps, the patient was shown the IUD which was removed intact.  Pt tolerated the procedure well.    Assessment and Plan [place] was seen today for removal of iud.  Diagnoses and all orders for this visit:  Amenorrhea- caused by IUD, IUD was removed Discussed that her period should return but not sure when  Screen for STD (sexually transmitted disease)- consent give -     HIV antibody -     Hepatitis B surface antigen -     RPR -     Pap IG, CT/NG NAA, and HPV (high risk)  Encounter for IUD removal- removed without complication  Cervical cancer screening- cervical cancer guidelines reviewed  Pap smear performed -     Pap IG, CT/NG NAA, and HPV (high risk)     Roman Sandall A Williom Cedar

## 2018-06-29 NOTE — Addendum Note (Signed)
Addended by: Suszanne Finch on: 06/29/2018 09:32 AM   Modules accepted: Orders

## 2018-06-30 LAB — HEPATITIS B SURFACE ANTIGEN: Hepatitis B Surface Ag: NEGATIVE

## 2018-06-30 LAB — RPR: RPR Ser Ql: NONREACTIVE

## 2018-06-30 LAB — HIV ANTIBODY (ROUTINE TESTING W REFLEX): HIV SCREEN 4TH GENERATION: NONREACTIVE

## 2018-07-03 LAB — PAP IG, CT-NG NAA, HPV HIGH-RISK
CHLAMYDIA, NUC. ACID AMP: NEGATIVE
Gonococcus by Nucleic Acid Amp: NEGATIVE
HPV, HIGH-RISK: NEGATIVE
PAP Smear Comment: 0

## 2018-08-31 ENCOUNTER — Ambulatory Visit: Payer: BLUE CROSS/BLUE SHIELD | Admitting: Family Medicine

## 2018-10-16 DIAGNOSIS — Z113 Encounter for screening for infections with a predominantly sexual mode of transmission: Secondary | ICD-10-CM | POA: Diagnosis not present

## 2018-10-16 DIAGNOSIS — R350 Frequency of micturition: Secondary | ICD-10-CM | POA: Diagnosis not present

## 2018-10-19 DIAGNOSIS — Z113 Encounter for screening for infections with a predominantly sexual mode of transmission: Secondary | ICD-10-CM | POA: Diagnosis not present

## 2018-11-25 ENCOUNTER — Telehealth: Payer: Self-pay | Admitting: Family Medicine

## 2018-11-25 NOTE — Telephone Encounter (Signed)
Copied from [place] [id number]. Topic: Quick Communication - See Telephone Encounter >> Nov 25, 2018 12:27 PM Bea Graff, NT wrote: CRM for notification. See Telephone encounter for: 11/25/18. Pt states that she has been completley off her psych meds and is needing to see Dr. Nolon Rod before April to get a refill. Please advise.

## 2018-11-26 ENCOUNTER — Ambulatory Visit: Payer: BLUE CROSS/BLUE SHIELD | Admitting: Emergency Medicine

## 2018-12-01 NOTE — Telephone Encounter (Signed)
Please advise 

## 2018-12-05 MED ORDER — FLUOXETINE HCL 40 MG PO CAPS: 40.0000 mg | ORAL_CAPSULE | Freq: Every day | ORAL | 0 refills | Status: DC

## 2018-12-05 NOTE — Telephone Encounter (Signed)
Please let the patient know that I sent in her prozac. With enough to last until she can get in for an appointment.  It is a 90 days supply of the medication. Please set her up for an appointment anywhere in the next 60 days.

## 2018-12-08 NOTE — Telephone Encounter (Signed)
I called pt. Pt states understand. Pt was transferred to front desk to make appt

## 2019-02-08 ENCOUNTER — Telehealth: Payer: Self-pay | Admitting: Family Medicine

## 2019-02-08 ENCOUNTER — Encounter: Payer: BLUE CROSS/BLUE SHIELD | Admitting: Family Medicine

## 2019-02-08 ENCOUNTER — Other Ambulatory Visit: Payer: Self-pay

## 2019-02-08 NOTE — Telephone Encounter (Addendum)
02/08/2019 - PATIENT HAD A WEBEX SCHEDULED FOR TODAY (02/08/2019 AT 9:20am TO FOLLOW-UP PER [facility]). THERE WERE 2 CALLS MADE BY [facility] WITH NO ANSWER. I LEFT A VOICE MAIL FOR HER TO RETURN OUR CALL TO RESCHEDULE HER VIRTUAL VISIT WITH DR. Nolon Rod. [place]

## 2019-03-17 NOTE — Progress Notes (Signed)
This encounter was created in error - please disregard.

## 2019-05-06 DIAGNOSIS — R0689 Other abnormalities of breathing: Secondary | ICD-10-CM | POA: Diagnosis not present

## 2019-05-06 DIAGNOSIS — F10929 Alcohol use, unspecified with intoxication, unspecified: Secondary | ICD-10-CM | POA: Diagnosis not present

## 2019-05-06 DIAGNOSIS — I10 Essential (primary) hypertension: Secondary | ICD-10-CM | POA: Diagnosis not present

## 2019-10-28 DIAGNOSIS — R05 Cough: Secondary | ICD-10-CM | POA: Diagnosis not present

## 2019-12-14 ENCOUNTER — Telehealth: Payer: Self-pay | Admitting: Family Medicine

## 2019-12-14 NOTE — Telephone Encounter (Signed)
error 

## 2019-12-20 ENCOUNTER — Telehealth (INDEPENDENT_AMBULATORY_CARE_PROVIDER_SITE_OTHER): Payer: BC Managed Care – PPO | Admitting: Family Medicine

## 2019-12-20 ENCOUNTER — Encounter: Payer: Self-pay | Admitting: Family Medicine

## 2019-12-20 VITALS — Wt 160.0 lb

## 2019-12-20 DIAGNOSIS — J301 Allergic rhinitis due to pollen: Secondary | ICD-10-CM

## 2019-12-20 DIAGNOSIS — F418 Other specified anxiety disorders: Secondary | ICD-10-CM | POA: Diagnosis not present

## 2019-12-20 MED ORDER — MONTELUKAST SODIUM 10 MG PO TABS: 10.0000 mg | ORAL_TABLET | Freq: Every day | ORAL | 3 refills | Status: DC

## 2019-12-20 MED ORDER — FLUOXETINE HCL 40 MG PO CAPS: 40.0000 mg | ORAL_CAPSULE | Freq: Every day | ORAL | 3 refills | Status: DC

## 2019-12-20 NOTE — Patient Instructions (Signed)
° ° ° °  If you have lab work done today you will be contacted with your lab results within the next 2 weeks.  If you have not heard from us then please contact us. The fastest way to get your results is to register for My Chart. ° ° °IF you received an x-ray today, you will receive an invoice from [facility]. Please contact [facility] at [phone] with questions or concerns regarding your invoice.  ° °IF you received labwork today, you will receive an invoice from [facility]. Please contact [facility] at [phone] with questions or concerns regarding your invoice.  ° °Our billing staff will not be able to assist you with questions regarding bills from these companies. ° °You will be contacted with the lab results as soon as they are available. The fastest way to get your results is to activate your My Chart account. Instructions are located on the last page of this paperwork. If you have not heard from us regarding the results in 2 weeks, please contact this office. °  ° ° ° °

## 2019-12-20 NOTE — Progress Notes (Signed)
Pt needs refills

## 2019-12-20 NOTE — Progress Notes (Addendum)
Telemedicine Encounter- SOAP NOTE Established Patient  This telephone encounter was conducted with the patient's (or proxy's) verbal consent via audio telecommunications: yes/no: Yes Patient was instructed to have this encounter in a suitably private space; and to only have persons present to whom they give permission to participate. In addition, patient identity was confirmed by use of name plus two identifiers (DOB and address).  I discussed the limitations, risks, security and privacy concerns of performing an evaluation and management service by telephone and the availability of in person appointments. I also discussed with the patient that there may be a patient responsible charge related to this service. The patient expressed understanding and agreed to proceed.  I spent a total of TIME; 0 MIN TO 60 MIN: 15 minutes talking with the patient or their proxy.      Established Patient Office Visit  Subjective:  Patient ID: Jacqueline Jimenez, female    DOB: 1976-10-17  Age: 44 y.o. MRN: [id number]  CC:  Chief Complaint  Patient presents with  . Medication Refill    pt needs prozac and singular no side effects     HPI Jacqueline Jimenez presents for   Patient reports that she is doing well with her depression She is not exercising due to covid closing the gyms She denies any unexpected weight gain She is also in a methadone program and there was a coordination of care form done that will be needed for her treatment.   Depression screen [facility] 2/9 12/20/2019 06/29/2018 02/26/2018 11/24/2017 11/24/2017  Decreased Interest 0 0 2 3 2   Down, Depressed, Hopeless 0 0 1 3 2   PHQ - 2 Score 0 0 3 6 4   Altered sleeping - - 3 3 3   Tired, decreased energy - - 1 3 2   Change in appetite - - 1 3 3   Feeling bad or failure about yourself  - - 1 3 2   Trouble concentrating - - 0 2 -  Moving slowly or fidgety/restless - - 0 2 1  Suicidal thoughts - - 0 0 0  PHQ-9 Score - - 9 22 15   Difficult doing  work/chores - - Somewhat difficult Somewhat difficult -  Some encounter information is confidential and restricted. Go to Review Flowsheets activity to see all data.   ALLERGIC RHINITIS MED REFILL She reports that she is taking her singulair and has minimal allergy symptoms. She denies any current sinus congestion, running nose or allergy symptoms.  She would like a flu shot   Past Medical History:  Diagnosis Date  . Blood type, Rh negative   . History of chicken pox   . PID (pelvic inflammatory disease)   . Pyloric stenosis     Past Surgical History:  Procedure Laterality Date  . APPENDECTOMY    . calf augmentation    . CESAREAN SECTION    . club foot correction    . DILATION AND CURETTAGE OF UTERUS    . HERNIA REPAIR    . RHINOPLASTY      Family History  Problem Relation Age of Onset  . Diabetes Maternal Grandmother   . Cancer Maternal Grandmother   . Breast cancer Maternal Grandmother 74  . Hypertension Father   . Cancer Mother   . Breast cancer Mother 68       survivor  . Breast cancer Other        unknown age and pased away    Social History   Socioeconomic History  .  Marital status: Married    Spouse name: Not on file  . Number of children: Not on file  . Years of education: Not on file  . Highest education level: Not on file  Occupational History  . Not on file  Tobacco Use  . Smoking status: Current Some Day Smoker  . Smokeless tobacco: Never Used  Substance and Sexual Activity  . Alcohol use: Yes    Comment: Sober times 2 days   . Drug use: No  . Sexual activity: Yes    Partners: Male    Birth control/protection: I.U.D.    Comment: mirena  Other Topics Concern  . Not on file  Social History Narrative  . Not on file   Social Determinants of Health   Financial Resource Strain:   . Difficulty of Paying Living Expenses: Not on file  Food Insecurity:   . Worried About Charity fundraiser in the Last Year: Not on file  . Ran Out of Food in  the Last Year: Not on file  Transportation Needs:   . Lack of Transportation (Medical): Not on file  . Lack of Transportation (Non-Medical): Not on file  Physical Activity:   . Days of Exercise per Week: Not on file  . Minutes of Exercise per Session: Not on file  Stress:   . Feeling of Stress : Not on file  Social Connections:   . Frequency of Communication with Friends and Family: Not on file  . Frequency of Social Gatherings with Friends and Family: Not on file  . Attends Religious Services: Not on file  . Active Member of Clubs or Organizations: Not on file  . Attends Archivist Meetings: Not on file  . Marital Status: Not on file  Intimate Partner Violence:   . Fear of Current or Ex-Partner: Not on file  . Emotionally Abused: Not on file  . Physically Abused: Not on file  . Sexually Abused: Not on file    Outpatient Medications Prior to Visit  Medication Sig Dispense Refill  . fluticasone (FLONASE) 50 MCG/ACT nasal spray Place 1 spray into both nostrils daily.    Marland Kitchen ibuprofen (ADVIL,MOTRIN) 200 MG tablet Take 400 mg by mouth every 6 (six) hours as needed for moderate pain.    Marland Kitchen levocetirizine (XYZAL) 5 MG tablet Take 1 tablet (5 mg total) by mouth every evening. (Patient taking differently: Take 5 mg by mouth every morning. ) 30 tablet 5  . levonorgestrel (MIRENA, 52 MG,) 20 MCG/24HR IUD Mirena 20 mcg/24 hr (5 years) intrauterine device  Take 1 device by intrauterine route.    Marland Kitchen LORazepam (ATIVAN) 0.5 MG tablet Take 1 tablet (0.5 mg total) by mouth at bedtime as needed. 10 tablet 0  . methadone (DOLOPHINE) 10 MG/ML solution Take 110 mg by mouth daily.     Marland Kitchen FLUoxetine (PROZAC) 40 MG capsule Take 1 capsule (40 mg total) by mouth daily. 90 capsule 0  . montelukast (SINGULAIR) 10 MG tablet TAKE 1 TABLET (10 MG TOTAL) BY MOUTH AT BEDTIME. 30 tablet 0   No facility-administered medications prior to visit.    No Known Allergies  ROS Review of Systems See hpi Review  of Systems  Constitutional: Negative for activity change, appetite change, chills and fever.  HENT: Negative for congestion, nosebleeds, trouble swallowing and voice change.   Respiratory: Negative for cough, shortness of breath and wheezing.   Gastrointestinal: Negative for diarrhea, nausea and vomiting.  Genitourinary: Negative for difficulty urinating, dysuria, flank pain  and hematuria.  Musculoskeletal: Negative for back pain, joint swelling and neck pain.  Neurological: Negative for dizziness, speech difficulty, light-headedness and numbness.  See HPI. All other review of systems negative.     Objective:    Physical Exam  Wt 160 lb (72.6 kg)   BMI 27.46 kg/m  Wt Readings from Last 3 Encounters:  12/20/19 160 lb (72.6 kg)  06/29/18 155 lb 12.8 oz (70.7 kg)  02/26/18 160 lb 6.4 oz (72.8 kg)   No physical exam due to phone visit  Health Maintenance Due  Topic Date Due  . INFLUENZA VACCINE  05/22/2019    There are no preventive care reminders to display for this patient.  No results found for: TSH Lab Results  Component Value Date   WBC 8.4 01/26/2018   HGB 12.9 01/26/2018   HCT 37.0 01/26/2018   MCV 90.2 01/26/2018   PLT 294 01/26/2018   Lab Results  Component Value Date   NA 141 01/26/2018   K 3.4 (L) 01/26/2018   CO2 23 01/26/2018   GLUCOSE 124 (H) 01/26/2018   BUN 12 01/26/2018   CREATININE 0.63 01/26/2018   BILITOT 0.2 (L) 01/26/2018   ALKPHOS 50 01/26/2018   AST 16 01/26/2018   ALT 12 (L) 01/26/2018   PROT 7.4 01/26/2018   ALBUMIN 4.2 01/26/2018   CALCIUM 9.1 01/26/2018   ANIONGAP 15 01/26/2018   No results found for: CHOL No results found for: HDL No results found for: LDLCALC No results found for: TRIG No results found for: CHOLHDL No results found for: HGBA1C    Assessment & Plan:   Problem List Items Addressed This Visit      Other   Anxious depression - Primary  - discussed continued prozac Will await form [facility]   Relevant  Medications   FLUoxetine (PROZAC) 40 MG capsule    Other Visit Diagnoses    Non-seasonal allergic rhinitis due to pollen    - discussed continuing singulair and wearing masks   Relevant Medications   montelukast (SINGULAIR) 10 MG tablet      Meds ordered this encounter  Medications  . FLUoxetine (PROZAC) 40 MG capsule    Sig: Take 1 capsule (40 mg total) by mouth daily.    Dispense:  90 capsule    Refill:  3  . montelukast (SINGULAIR) 10 MG tablet    Sig: Take 1 tablet (10 mg total) by mouth at bedtime.    Dispense:  90 tablet    Refill:  3    Follow-up: No follow-ups on file.   I discussed the assessment and treatment plan with the patient. The patient was provided an opportunity to ask questions and all were answered. The patient agreed with the plan and demonstrated an understanding of the instructions.   The patient was advised to call back or seek an in-person evaluation if the symptoms worsen or if the condition fails to improve as anticipated.  I provided 15 minutes of non-face-to-face time during this encounter.   Forrest Moron, MD

## 2020-05-25 ENCOUNTER — Other Ambulatory Visit: Payer: Self-pay | Admitting: Family Medicine

## 2020-07-10 ENCOUNTER — Telehealth: Payer: Self-pay

## 2020-07-10 DIAGNOSIS — J301 Allergic rhinitis due to pollen: Secondary | ICD-10-CM

## 2020-07-10 DIAGNOSIS — F418 Other specified anxiety disorders: Secondary | ICD-10-CM

## 2020-07-10 MED ORDER — MONTELUKAST SODIUM 10 MG PO TABS: 10.0000 mg | ORAL_TABLET | Freq: Every day | ORAL | 0 refills | Status: DC

## 2020-07-10 MED ORDER — FLUOXETINE HCL 40 MG PO CAPS: 40.0000 mg | ORAL_CAPSULE | Freq: Every day | ORAL | 0 refills | Status: DC

## 2020-07-10 NOTE — Telephone Encounter (Signed)
Pt. Called to schedule TOC with morrow and request refills on Prozac, lorazepam, Singulair) until the time of the appt.

## 2020-10-10 ENCOUNTER — Ambulatory Visit: Payer: BC Managed Care – PPO | Admitting: Registered Nurse

## 2020-10-16 ENCOUNTER — Ambulatory Visit: Payer: Self-pay

## 2020-10-16 NOTE — Telephone Encounter (Signed)
      Patient want to know if she can trust home test that has tested her negative for COVID-19  She was positive the 6th and 8 of December.  She was informed that as long as she can say that it has been at least 10 days of isolation and no fever for 24 hours and all respiratory symptoms very improved.  Patient states that she is still taking Augmentin for secondary infection and cough.  Patient was advised that she should remain in isolation because she is coughing.  She is not required to retest but can if she wishes.  We do not recommend retest for 90 days unless she becomes symptomatic.  She verbalized understanding. Reason for Disposition . Health Information question, no triage required and triager able to answer question  Answer Assessment - Initial Assessment Questions 1. REASON FOR CALL or QUESTION: "What is your reason for calling today?" or "How can I best help you?" or "What question do you have that I can help answer?"     Patient want to know if she can trust home test that has tested her negative for COVID-19  She was positive the 6th and 8 of december  Protocols used: INFORMATION ONLY CALL - NO TRIAGE-A-AH

## 2021-05-29 ENCOUNTER — Ambulatory Visit: Payer: BLUE CROSS/BLUE SHIELD | Admitting: Physician Assistant

## 2021-06-06 ENCOUNTER — Ambulatory Visit (INDEPENDENT_AMBULATORY_CARE_PROVIDER_SITE_OTHER): Payer: 59 | Admitting: Physician Assistant

## 2021-06-06 ENCOUNTER — Other Ambulatory Visit: Payer: Self-pay

## 2021-06-06 ENCOUNTER — Encounter: Payer: Self-pay | Admitting: Physician Assistant

## 2021-06-06 VITALS — BP 120/80 | HR 75 | Temp 97.3°F | Ht 64.5 in | Wt 159.2 lb

## 2021-06-06 DIAGNOSIS — F418 Other specified anxiety disorders: Secondary | ICD-10-CM | POA: Diagnosis not present

## 2021-06-06 DIAGNOSIS — J301 Allergic rhinitis due to pollen: Secondary | ICD-10-CM

## 2021-06-06 DIAGNOSIS — F1120 Opioid dependence, uncomplicated: Secondary | ICD-10-CM | POA: Diagnosis not present

## 2021-06-06 MED ORDER — MONTELUKAST SODIUM 10 MG PO TABS: 10.0000 mg | ORAL_TABLET | Freq: Every day | ORAL | 3 refills | Status: DC

## 2021-06-06 MED ORDER — SERTRALINE HCL 50 MG PO TABS: 50.0000 mg | ORAL_TABLET | Freq: Every day | ORAL | 3 refills | Status: DC

## 2021-06-06 NOTE — Progress Notes (Signed)
Jacqueline Jimenez is a 45 y.o. female here to establish care.  I acted as a Education administrator for [employer], PA-C Anselmo Pickler, LPN   History of Present Illness:   Chief Complaint  Patient presents with  . Establish Care   Anxiety -- going through a separation for several years. Previously prescribed prozac but could not take prozac due to capsule size. Taking zoloft 50 mg daily x 3 weeks and 0.5 mg ativan. Can't tell a huge difference with the zoloft yet. Takes ativan rarely -- for flights, car trips, or if she has been without sleep for awhile. Denies current or past SI/HI.  Allergies - takes singulair 10 mg and tolerates well, has been on for a few years, also takes xyzal and flonase. Has environmental allergies to animals.  Hx of opiate abuse -- during freshman and sophomore year of college had calf augmentation and her rhinoplasty and got addicted to opioids. Current patient at [facility] since 2004. No hx of overdose.  Health Maintenance: Immunizations -- UTD Colonoscopy -- N/A Mammogram -- overdue PAP -- UTD Bone Density -- N/A Weight -- Weight: 159 lb 4 oz (72.2 kg)    Depression screen PHQ 2/9 06/06/2021  Decreased Interest 1  Down, Depressed, Hopeless 1  PHQ - 2 Score 2  Altered sleeping 2  Tired, decreased energy 2  Change in appetite 2  Feeling bad or failure about yourself  3  Trouble concentrating 1  Moving slowly or fidgety/restless 0  Suicidal thoughts 0  PHQ-9 Score 12  Difficult doing work/chores Somewhat difficult  Some encounter information is confidential and restricted. Go to Review Flowsheets activity to see all data.    GAD 7 : Generalized Anxiety Score 06/06/2021 11/24/2017  Nervous, Anxious, on Edge 3 3  Control/stop worrying 3 3  Worry too much - different things 2 3  Trouble relaxing 1 3  Restless 1 3  Easily annoyed or irritable 2 2  Afraid - awful might happen 2 2  Total GAD 7 Score 14 19  Anxiety Difficulty Somewhat difficult  Extremely difficult  Some encounter information is confidential and restricted. Go to Review Flowsheets activity to see all data.     Other providers/specialists: Patient Care Team: [name], [place] as PCP - General (Physician Assistant)   Past Medical History:  Diagnosis Date  . Allergy    seasonal  . Anxiety   . Arthritis   . Blood type, Rh negative   . Depression   . History of chicken pox   . PID (pelvic inflammatory disease)    require hospitalizaiton at age 44  . Pyloric stenosis    as an infant     Social History   Tobacco Use  . Smoking status: Former    Packs/day: 0.25    Years: 11.00    Pack years: 2.75    Types: Cigarettes    Quit date: 2006    Years since quitting: 16.6  . Smokeless tobacco: Never  Vaping Use  . Vaping Use: Never used  Substance Use Topics  . Alcohol use: Not Currently    Comment: Sober times 2 days   . Drug use: Not Currently    Types: Other-see comments    Comment: Opiates    Past Surgical History:  Procedure Laterality Date  . APPENDECTOMY    . calf augmentation    . CESAREAN SECTION    . club foot correction    . DILATION AND CURETTAGE OF UTERUS    .  HERNIA REPAIR    . RHINOPLASTY      Family History  Problem Relation Age of Onset  . Asthma Mother   . Breast cancer Mother 34       survivor  . Hypertension Father   . Diabetes Maternal Grandmother   . Breast cancer Maternal Grandmother 51  . Breast cancer Other        unknown age and pased away    No Known Allergies   Current Medications:   Current Outpatient Medications:  .  fluticasone (FLONASE) 50 MCG/ACT nasal spray, Place 1 spray into both nostrils daily., Disp: , Rfl:  .  ibuprofen (ADVIL,MOTRIN) 200 MG tablet, Take 400 mg by mouth every 6 (six) hours as needed for moderate pain., Disp: , Rfl:  .  levocetirizine (XYZAL) 5 MG tablet, Take 1 tablet (5 mg total) by mouth every evening. (Patient taking differently: Take 5 mg by mouth every morning.),  Disp: 30 tablet, Rfl: 5 .  LORazepam (ATIVAN) 0.5 MG tablet, lorazepam 0.5 mg tablet, Disp: , Rfl:  .  methadone (DOLOPHINE) 10 MG/ML solution, Take 120 mg by mouth daily. [facility], Disp: , Rfl:  .  montelukast (SINGULAIR) 10 MG tablet, Take 1 tablet (10 mg total) by mouth at bedtime., Disp: 90 tablet, Rfl: 3 .  sertraline (ZOLOFT) 50 MG tablet, Take 1 tablet (50 mg total) by mouth daily., Disp: 90 tablet, Rfl: 3   Review of Systems:   ROS Negative unless otherwise specified per HPI.  Vitals:   Vitals:   06/06/21 0918  BP: 120/80  Pulse: 75  Temp: (!) 97.3 F (36.3 C)  TempSrc: Temporal  SpO2: 96%  Weight: 159 lb 4 oz (72.2 kg)  Height: 5' 4.5" (1.638 m)      Body mass index is 26.91 kg/m.  Physical Exam:   Physical Exam Vitals and nursing note reviewed.  Constitutional:      General: She is not in acute distress.    Appearance: She is well-developed. She is not ill-appearing or toxic-appearing.  Cardiovascular:     Rate and Rhythm: Normal rate and regular rhythm.     Pulses: Normal pulses.     Heart sounds: Normal heart sounds, S1 normal and S2 normal.     Comments: No LE edema Pulmonary:     Effort: Pulmonary effort is normal.     Breath sounds: Normal breath sounds.  Skin:    General: Skin is warm and dry.  Neurological:     Mental Status: She is alert.     GCS: GCS eye subscore is 4. GCS verbal subscore is 5. GCS motor subscore is 6.  Psychiatric:        Speech: Speech normal.        Behavior: Behavior normal. Behavior is cooperative.    Assessment and Plan:   Jacqueline Jimenez was seen today for establish care.  Diagnoses and all orders for this visit:  Non-seasonal allergic rhinitis due to pollen Well controlled with singulair, flonase and xyzal -     montelukast (SINGULAIR) 10 MG tablet; Take 1 tablet (10 mg total) by mouth at bedtime.  Anxious depression Continue zoloft 50 mg daily Continue to monitor Consider increase if no  improvement of symptoms in 4 weeks I discussed with patient that if they develop any SI, to tell someone immediately and seek medical attention.  Long-term current use of methadone for opiate dependence ([place]) Management per methadone clinic  Other orders -     sertraline (  ZOLOFT) 50 MG tablet; Take 1 tablet (50 mg total) by mouth daily.  CMA or LPN served as scribe during this visit. History, Physical, and Plan performed by medical provider. The above documentation has been reviewed and is accurate and complete.  [name], PA-C

## 2021-06-06 NOTE — Patient Instructions (Signed)
It was great to see you!  I will be in touch when I get your blood work if there are concerns.  Let's follow-up in 6 months to discuss zoloft, sooner if you have concerns.  Take care,  [name] PA-C

## 2021-07-23 ENCOUNTER — Other Ambulatory Visit: Payer: Self-pay | Admitting: Physician Assistant

## 2021-07-23 ENCOUNTER — Other Ambulatory Visit: Payer: Self-pay

## 2021-07-23 MED ORDER — LORAZEPAM 0.5 MG PO TABS: ORAL_TABLET | ORAL | 0 refills | Status: DC

## 2021-07-23 NOTE — Telephone Encounter (Signed)
  Encourage patient to contact the pharmacy for refills or they can request refills through [facility]:  06/06/2021  NEXT APPOINTMENT DATE: 12/07/2021  MEDICATION:LORazepam (ATIVAN) 0.5 MG tablet  Is the patient out of medication? No  PHARMACY: [facility]  COMMENTS: Patient has a weeks worth left, insurance requires PA.

## 2021-07-23 NOTE — Telephone Encounter (Signed)
Pt requesting refill on Lorazepam. Last OV 06/06/2021.

## 2021-09-10 ENCOUNTER — Other Ambulatory Visit: Payer: Self-pay

## 2021-09-10 ENCOUNTER — Ambulatory Visit (INDEPENDENT_AMBULATORY_CARE_PROVIDER_SITE_OTHER): Payer: 59 | Admitting: Physician Assistant

## 2021-09-10 ENCOUNTER — Encounter: Payer: Self-pay | Admitting: Physician Assistant

## 2021-09-10 VITALS — BP 128/82 | HR 82 | Temp 97.2°F | Ht 64.5 in | Wt 162.0 lb

## 2021-09-10 DIAGNOSIS — F418 Other specified anxiety disorders: Secondary | ICD-10-CM | POA: Diagnosis not present

## 2021-09-10 DIAGNOSIS — R030 Elevated blood-pressure reading, without diagnosis of hypertension: Secondary | ICD-10-CM | POA: Diagnosis not present

## 2021-09-10 DIAGNOSIS — J029 Acute pharyngitis, unspecified: Secondary | ICD-10-CM | POA: Diagnosis not present

## 2021-09-10 DIAGNOSIS — M79641 Pain in right hand: Secondary | ICD-10-CM

## 2021-09-10 LAB — POCT RAPID STREP A (OFFICE): Rapid Strep A Screen: POSITIVE — AB

## 2021-09-10 MED ORDER — SERTRALINE HCL 50 MG PO TABS: 50.0000 mg | ORAL_TABLET | Freq: Every day | ORAL | 3 refills | Status: DC

## 2021-09-10 MED ORDER — MELOXICAM 15 MG PO TABS: 15.0000 mg | ORAL_TABLET | Freq: Every day | ORAL | 0 refills | Status: DC

## 2021-09-10 MED ORDER — AMOXICILLIN 400 MG/5ML PO SUSR: 500.0000 mg | Freq: Two times a day (BID) | ORAL | 0 refills | Status: AC

## 2021-09-10 MED ORDER — AMOXICILLIN 875 MG PO TABS: 875.0000 mg | ORAL_TABLET | Freq: Two times a day (BID) | ORAL | 0 refills | Status: DC

## 2021-09-10 NOTE — Progress Notes (Signed)
Jacqueline Jimenez is a 45 y.o. female here for hand pain.   History of Present Illness:   Chief Complaint  Patient presents with  . Hand Pain    Pt c.o right hand pain off and on x 1 week. Taking Ibuprofen and Aspirin with some relief.  . Sinus Problem    Pt c/o sinus pressure, headache, dry non-productive cough and clear nasal drainage x 2 weeks. COVID test done last week Negative. Denies fever or chills. Jacqueline Jimenez has been taking Mucinex and Ibuprofen.    HPI  Hand Pain Jacqueline Jimenez has been experiencing intermittent right hand pain for the last week. Pt reports the pain began while having relations with her husband mainly underneath her middle finger's knuckle. Jacqueline Jimenez has tried taking ibuprofen and aspirin which provided her with relief. Denies swelling or redness. Jacqueline Jimenez has been avoiding use of her hand which has helped.  Sinus Issue In addition, Jacqueline Jimenez reports Jacqueline Jimenez has been experiencing sinus pressure, headache, dry non productive cough, and clear nasal drainage for two weeks. Initially Jacqueline Jimenez was also having a sore throat and post nasal drip, but this soon resolved itself. Jacqueline Jimenez has taken an at home COVID tested which resulted as negative. In effort to treat sx Jacqueline Jimenez has taken mucinex and ibuprofen which provided her with minor relief. Denies fever or chills.   Anxiety/Depression Currently compliant with zoloft 50 mg daily and ativan 0.5 mg prn with no adverse effects. Jacqueline Jimenez is managing well. Denies SI/HI. Needs refill on zoloft.  Elevated BP Reading  According to pt, Jacqueline Jimenez recently underwent a physical at a local clinic, where her BP resulted as 150/100. Upon a second reading done at the end of the visit, it was still elevated. Denies chest pain, SOB, blurred vision, dizziness, unusual headaches, lower leg swelling. Pt also denies excessive caffeine intake, stimulant usage, excessive alcohol intake, or increase in salt consumption. Jacqueline Jimenez does not have a hx of HTN, but does have a fhx of HTN.   BP Readings from  Last 3 Encounters:  09/10/21 128/82  06/06/21 120/80  06/29/18 118/74    150/100 upon annual physical at clinic Past Medical History:  Diagnosis Date  . Allergy    seasonal  . Anxiety   . Arthritis   . Blood type, Rh negative   . Depression   . History of chicken pox   . PID (pelvic inflammatory disease)    require hospitalizaiton at age 37  . Pyloric stenosis    as an infant     Social History   Tobacco Use  . Smoking status: Former    Packs/day: 0.25    Years: 11.00    Pack years: 2.75    Types: Cigarettes    Quit date: 2006    Years since quitting: 16.8  . Smokeless tobacco: Never  Vaping Use  . Vaping Use: Never used  Substance Use Topics  . Alcohol use: Not Currently    Comment: Sober times 2 days   . Drug use: Not Currently    Types: Other-see comments    Comment: Opiates    Past Surgical History:  Procedure Laterality Date  . APPENDECTOMY    . calf augmentation    . CESAREAN SECTION    . club foot correction    . DILATION AND CURETTAGE OF UTERUS    . HERNIA REPAIR    . RHINOPLASTY      Family History  Problem Relation Age of Onset  . Asthma Mother   .  Breast cancer Mother 36       survivor  . Hypertension Father   . Diabetes Maternal Grandmother   . Breast cancer Maternal Grandmother 84  . Breast cancer Other        unknown age and pased away    No Known Allergies  Current Medications:   Current Outpatient Medications:  .  fluticasone (FLONASE) 50 MCG/ACT nasal spray, Place 1 spray into both nostrils daily., Disp: , Rfl:  .  ibuprofen (ADVIL,MOTRIN) 200 MG tablet, Take 400 mg by mouth every 6 (six) hours as needed for moderate pain., Disp: , Rfl:  .  levocetirizine (XYZAL) 5 MG tablet, Take 1 tablet (5 mg total) by mouth every evening. (Patient taking differently: Take 5 mg by mouth every morning.), Disp: 30 tablet, Rfl: 5 .  LORazepam (ATIVAN) 0.5 MG tablet, Take 0.5 mg tablet as needed up to daily for anxiety, Disp: 20 tablet, Rfl:  0 .  methadone (DOLOPHINE) 10 MG/ML solution, Take 120 mg by mouth daily. [facility], Disp: , Rfl:  .  montelukast (SINGULAIR) 10 MG tablet, Take 1 tablet (10 mg total) by mouth at bedtime., Disp: 90 tablet, Rfl: 3 .  sertraline (ZOLOFT) 50 MG tablet, Take 1 tablet (50 mg total) by mouth daily., Disp: 90 tablet, Rfl: 3   Review of Systems:   ROS Negative unless otherwise specified per HPI.  Vitals:   Vitals:   09/10/21 1303  BP: 128/82  Pulse: 82  Temp: (!) 97.2 F (36.2 C)  TempSrc: Temporal  SpO2: 97%  Weight: 162 lb (73.5 kg)  Height: 5' 4.5" (1.638 m)     Body mass index is 27.38 kg/m.  Physical Exam:   Physical Exam Vitals and nursing note reviewed.  Constitutional:      General: Jacqueline Jimenez is not in acute distress.    Appearance: Jacqueline Jimenez is well-developed. Jacqueline Jimenez is not ill-appearing or toxic-appearing.  HENT:     Head: Normocephalic and atraumatic.     Right Ear: Tympanic membrane, ear canal and external ear normal. Tympanic membrane is not erythematous, retracted or bulging.     Left Ear: Tympanic membrane, ear canal and external ear normal. Tympanic membrane is not erythematous, retracted or bulging.     Nose: Nose normal.     Right Sinus: No maxillary sinus tenderness or frontal sinus tenderness.     Left Sinus: No maxillary sinus tenderness or frontal sinus tenderness.     Mouth/Throat:     Pharynx: Uvula midline. Posterior oropharyngeal erythema present.  Eyes:     General: Lids are normal.     Conjunctiva/sclera: Conjunctivae normal.  Neck:     Trachea: Trachea normal.  Cardiovascular:     Rate and Rhythm: Normal rate and regular rhythm.     Heart sounds: Normal heart sounds, S1 normal and S2 normal.  Pulmonary:     Effort: Pulmonary effort is normal.     Breath sounds: Normal breath sounds. No decreased breath sounds, wheezing, rhonchi or rales.  Musculoskeletal:     Right hand: Bony tenderness present.     Comments: Normal ROM of R hand Very  minimal TTP of central aspect of R hand, no obvious swelling or deformity  Lymphadenopathy:     Cervical: Cervical adenopathy present.  Skin:    General: Skin is warm and dry.  Neurological:     Mental Status: Jacqueline Jimenez is alert.  Psychiatric:        Speech: Speech normal.  Behavior: Behavior normal. Behavior is cooperative.   Results for orders placed or performed in visit on 09/10/21  POCT rapid strep A  Result Value Ref Range   Rapid Strep A Screen Positive (A) Negative     Assessment and Plan:   Right Hand Pain -Possible strain -Symptoms improving with time -Continue conservative treatment of ACE bandage, rest and start oral mobic 15 mg daily - Follow up in two weeks if lack of improvement or new sx -- will refer to sports medicine  Anxious Depression -Continue zoloft 50 mg daily -Continue to monitor -I discussed with patient that if they develop any SI, to tell someone immediately and seek medical attention.  Elevated Blood Pressure - Begin monitoring at home  - Normotensive today - Please follow up if symptoms or concerns occur immediately or if readings >140/90  Sore Throat -Strep test positive; no red flags -Start oral amoxicillin 500 mg BID (patient requesting liquid) -If new/worsening symptoms, I have asked her to let us know  I,Havlyn C Ratchford,acting as a scribe for [employer], PA.,have documented all relevant documentation on the behalf of [name], PA,as directed by  [name], PA while in the presence of [name], [place].  I, [name], [place], have reviewed all documentation for this visit. The documentation on 09/10/21 for the exam, diagnosis, procedures, and orders are all accurate and complete.   [name], PA-C

## 2021-09-10 NOTE — Patient Instructions (Addendum)
It was great to see you!  -Zoloft 50 mg refilled  -Keep an eye on your blood pressure. If consistently > 140/90, please let us know.  -For your hand, please trial compression with ACE wrap and oral meloxicam 15 mg daily. You do not need to take oral ibuprofen/advil while on this. If no better or any worse, after two weeks, let me know and we will refer you to sports medicine  -You have strep throat. I have sent in oral amoxicillin. Push fluids and get plenty of rest. Please return if you are not improving as expected, or if you have high fevers (>101.5) or difficulty swallowing or worsening productive cough.  I hope you start feeling better soon!   Take care,  [name] PA-C

## 2021-10-04 ENCOUNTER — Other Ambulatory Visit: Payer: Self-pay | Admitting: Physician Assistant

## 2021-10-04 NOTE — Telephone Encounter (Signed)
Last OV- 09/10/21 Last refill: 07/23/21 Disp: 20 tabs Refills: 0

## 2021-10-05 ENCOUNTER — Other Ambulatory Visit: Payer: Self-pay | Admitting: Physician Assistant

## 2021-10-05 MED ORDER — LORAZEPAM 0.5 MG PO TABS: ORAL_TABLET | ORAL | 0 refills | Status: DC

## 2021-10-17 ENCOUNTER — Telehealth: Payer: Self-pay

## 2021-10-17 NOTE — Telephone Encounter (Signed)
error 

## 2021-10-18 ENCOUNTER — Telehealth (INDEPENDENT_AMBULATORY_CARE_PROVIDER_SITE_OTHER): Payer: 59 | Admitting: Physician Assistant

## 2021-10-18 ENCOUNTER — Encounter: Payer: Self-pay | Admitting: Physician Assistant

## 2021-10-18 ENCOUNTER — Other Ambulatory Visit: Payer: Self-pay

## 2021-10-18 DIAGNOSIS — J329 Chronic sinusitis, unspecified: Secondary | ICD-10-CM

## 2021-10-18 MED ORDER — DOXYCYCLINE HYCLATE 100 MG PO TABS: 100.0000 mg | ORAL_TABLET | Freq: Two times a day (BID) | ORAL | 0 refills | Status: DC
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
## 2021-10-23 ENCOUNTER — Telehealth: Payer: Self-pay | Admitting: Physician Assistant

## 2021-10-23 NOTE — Telephone Encounter (Signed)
Pt called and said after her appt last week and said Jacqueline Jimenez told her they would send in a zpack and that Doxycycline was sent in instead and that when she has taken that in the past it makes her feel sick and that if she does need to still take doxycycline then she will need something else to go along with it to make her not feel sick. Please advise

## 2021-10-24 NOTE — Telephone Encounter (Signed)
Left voice message for patient to call clinic.  

## 2021-10-26 NOTE — Telephone Encounter (Signed)
Spoke to pt told her Aldona Bar, said we called to tell her after her appointment last week that we could not send in Azithromycin as it has a significant interaction with her current prescription of Methadone. She told me she cannot take oral amoxicillin due to size of tablet.  So the option is doxycycline and we can send in something to help her stomach or we can trial omnicef.  Pt said she was out of town, sorry did not get back to [place]. Asked pt if still having symptoms? Pt said cough and some congestion, expectorating lt yellow/white sputum. Denies fever or chills. Pt said has taken Azithromycin before and has never had any problems with it when on Methadone. Pt said she is using Flonase like Aldona Bar told her to. Told her Aldona Bar is gone for the day, I will discuss with her on Monday and get back to you if she wants you to take anything else. Pt verbalized understanding.

## 2021-10-29 ENCOUNTER — Other Ambulatory Visit: Payer: Self-pay | Admitting: Physician Assistant

## 2021-10-29 MED ORDER — AMOXICILLIN-POT CLAVULANATE 250-62.5 MG/5ML PO SUSR: 500.0000 mg | Freq: Three times a day (TID) | ORAL | 0 refills | Status: AC

## 2021-10-29 MED ORDER — AMOXICILLIN-POT CLAVULANATE 875-125 MG PO TABS: 1.0000 | ORAL_TABLET | Freq: Two times a day (BID) | ORAL | 0 refills | Status: DC

## 2021-10-29 NOTE — Telephone Encounter (Signed)
Spoke to pt told her discussed medication Azithromycin with Aldona Bar and she said okay to send in for you. Pt said she looked up Azithromycin and saw the possible side effects of taking with Methadone and it can affect your heart. Pt said she decided she does not want to take. Is the something else? Told pt I will have to check with [facility] and get back to you. Pt verbalized understanding.

## 2021-10-29 NOTE — Telephone Encounter (Signed)
Spoke to pt told her Samantha sent in Augmentin for her. Pt verbalized understanding and asked if liquid? Told pt no tablet. Pt said she needs liquid. Told pt okay will send liquid to the pharmacy. Pt verbalized understanding.

## 2021-10-29 NOTE — Telephone Encounter (Signed)
Called pharmacy [facility] and spoke to [facility] told her to cancel Rx for Augmentin Tablets, new Rx was sent for Liquid. Edie verbalized understanding.

## 2021-10-29 NOTE — Telephone Encounter (Signed)
Please advise what liquid Augmentin to send?

## 2021-10-29 NOTE — Telephone Encounter (Signed)
Jacqueline Jimenez, pt decided she does not want to take Azithromycin. Please advise what else pt can take?

## 2021-12-07 ENCOUNTER — Ambulatory Visit: Payer: 59 | Admitting: Physician Assistant

## 2021-12-07 NOTE — Progress Notes (Incomplete)
Alianys SADEEL FIDDLER is a 46 y.o. female here for a follow up of a pre-existing problem.  SCRIBE STATEMENT  History of Present Illness:   No chief complaint on file.   HPI  Past Medical History:  Diagnosis Date   Allergy    seasonal   Anxiety    Arthritis    Blood type, Rh negative    Depression    History of chicken pox    PID (pelvic inflammatory disease)    require hospitalizaiton at age 55   Pyloric stenosis    as an infant     Social History   Tobacco Use   Smoking status: Former    Packs/day: 0.25    Years: 11.00    Pack years: 2.75    Types: Cigarettes    Quit date: 2006    Years since quitting: 17.1   Smokeless tobacco: Never  Vaping Use   Vaping Use: Never used  Substance Use Topics   Alcohol use: Not Currently    Comment: Sober times 2 days    Drug use: Not Currently    Types: Other-see comments    Comment: Opiates    Past Surgical History:  Procedure Laterality Date   APPENDECTOMY     calf augmentation     CESAREAN SECTION     club foot correction     DILATION AND CURETTAGE OF UTERUS     HERNIA REPAIR     RHINOPLASTY      Family History  Problem Relation Age of Onset   Asthma Mother    Breast cancer Mother 59       survivor   Hypertension Father    Diabetes Maternal Grandmother    Breast cancer Maternal Grandmother 67   Breast cancer Other        unknown age and pased away    No Known Allergies  Current Medications:   Current Outpatient Medications:    fluticasone (FLONASE) 50 MCG/ACT nasal spray, Place 1 spray into both nostrils daily., Disp: , Rfl:    ibuprofen (ADVIL,MOTRIN) 200 MG tablet, Take 400 mg by mouth every 6 (six) hours as needed for moderate pain., Disp: , Rfl:    levocetirizine (XYZAL) 5 MG tablet, Take 1 tablet (5 mg total) by mouth every evening. (Patient taking differently: Take 5 mg by mouth every morning.), Disp: 30 tablet, Rfl: 5   LORazepam (ATIVAN) 0.5 MG tablet, Take 0.5 mg tablet as needed up to daily for  anxiety, Disp: 20 tablet, Rfl: 0   meloxicam (MOBIC) 15 MG tablet, Take 1 tablet (15 mg total) by mouth daily., Disp: 30 tablet, Rfl: 0   methadone (DOLOPHINE) 10 MG/ML solution, Take 120 mg by mouth daily. [facility], Disp: , Rfl:    montelukast (SINGULAIR) 10 MG tablet, Take 1 tablet (10 mg total) by mouth at bedtime., Disp: 90 tablet, Rfl: 3   sertraline (ZOLOFT) 50 MG tablet, Take 1 tablet (50 mg total) by mouth daily., Disp: 90 tablet, Rfl: 3   Review of Systems:   ROS  Vitals:   There were no vitals filed for this visit.   There is no height or weight on file to calculate BMI.  Physical Exam:   Physical Exam  Assessment and Plan:   @DIAGLIST @     ***  [name], PA-C

## 2021-12-17 ENCOUNTER — Other Ambulatory Visit: Payer: Self-pay

## 2021-12-17 ENCOUNTER — Encounter: Payer: Self-pay | Admitting: Physician Assistant

## 2021-12-17 ENCOUNTER — Other Ambulatory Visit (HOSPITAL_COMMUNITY)
Admission: RE | Admit: 2021-12-17 | Discharge: 2021-12-17 | Disposition: A | Discharge status: Home / Self Care | Payer: 59 | Source: Ambulatory Visit | Attending: Physician Assistant | Admitting: Physician Assistant

## 2021-12-17 ENCOUNTER — Ambulatory Visit (INDEPENDENT_AMBULATORY_CARE_PROVIDER_SITE_OTHER): Payer: 59 | Admitting: Physician Assistant

## 2021-12-17 VITALS — BP 130/80 | HR 92 | Temp 98.3°F | Ht 64.5 in | Wt 158.5 lb

## 2021-12-17 DIAGNOSIS — Z13220 Encounter for screening for lipoid disorders: Secondary | ICD-10-CM

## 2021-12-17 DIAGNOSIS — Z136 Encounter for screening for cardiovascular disorders: Secondary | ICD-10-CM | POA: Diagnosis not present

## 2021-12-17 DIAGNOSIS — Z113 Encounter for screening for infections with a predominantly sexual mode of transmission: Secondary | ICD-10-CM | POA: Insufficient documentation

## 2021-12-17 DIAGNOSIS — Z124 Encounter for screening for malignant neoplasm of cervix: Secondary | ICD-10-CM | POA: Insufficient documentation

## 2021-12-17 DIAGNOSIS — F418 Other specified anxiety disorders: Secondary | ICD-10-CM | POA: Diagnosis not present

## 2021-12-17 DIAGNOSIS — E663 Overweight: Secondary | ICD-10-CM

## 2021-12-17 DIAGNOSIS — Z0000 Encounter for general adult medical examination without abnormal findings: Secondary | ICD-10-CM

## 2021-12-17 LAB — COMPREHENSIVE METABOLIC PANEL
ALT: 17 U/L (ref 0–35)
AST: 22 U/L (ref 0–37)
Albumin: 4.6 g/dL (ref 3.5–5.2)
Alkaline Phosphatase: 72 U/L (ref 39–117)
BUN: 9 mg/dL (ref 6–23)
CO2: 31 mEq/L (ref 19–32)
Calcium: 9.1 mg/dL (ref 8.4–10.5)
Chloride: 100 mEq/L (ref 96–112)
Creatinine, Ser: 0.76 mg/dL (ref 0.40–1.20)
GFR: 95.24 mL/min (ref >60.00)
Glucose, Bld: 100 mg/dL — ABNORMAL HIGH (ref 70–99)
Potassium: 4.1 mEq/L (ref 3.5–5.1)
Sodium: 138 mEq/L (ref 135–145)
Total Bilirubin: 0.3 mg/dL (ref 0.2–1.2)
Total Protein: 7.7 g/dL (ref 6.0–8.3)

## 2021-12-17 LAB — CBC WITH DIFFERENTIAL/PLATELET
Basophils Absolute: 0.1 10*3/uL (ref 0.0–0.1)
Basophils Relative: 0.7 % (ref 0.0–3.0)
Eosinophils Absolute: 0.1 10*3/uL (ref 0.0–0.7)
Eosinophils Relative: 1 % (ref 0.0–5.0)
HCT: 34.1 % — ABNORMAL LOW (ref 36.0–46.0)
Hemoglobin: 11.1 g/dL — ABNORMAL LOW (ref 12.0–15.0)
Lymphocytes Relative: 17.6 % (ref 12.0–46.0)
Lymphs Abs: 1.3 10*3/uL (ref 0.7–4.0)
MCHC: 32.4 g/dL (ref 30.0–36.0)
MCV: 88.4 fl (ref 78.0–100.0)
Monocytes Absolute: 0.5 10*3/uL (ref 0.1–1.0)
Monocytes Relative: 6.3 % (ref 3.0–12.0)
Neutro Abs: 5.4 10*3/uL (ref 1.4–7.7)
Neutrophils Relative %: 74.4 % (ref 43.0–77.0)
Platelets: 268 10*3/uL (ref 150.0–400.0)
RBC: 3.86 Mil/uL — ABNORMAL LOW (ref 3.87–5.11)
RDW: 17.2 % — ABNORMAL HIGH (ref 11.5–15.5)
WBC: 7.3 10*3/uL (ref 4.0–10.5)

## 2021-12-17 LAB — LIPID PANEL
Cholesterol: 251 mg/dL — ABNORMAL HIGH (ref 0–200)
HDL: 65.4 mg/dL (ref >39.00)
LDL Cholesterol: 156 mg/dL — ABNORMAL HIGH (ref 0–99)
NonHDL: 185.8
Total CHOL/HDL Ratio: 4
Triglycerides: 147 mg/dL (ref 0.0–149.0)
VLDL: 29.4 mg/dL (ref 0.0–40.0)

## 2021-12-17 MED ORDER — SERTRALINE HCL 100 MG PO TABS: 100.0000 mg | ORAL_TABLET | Freq: Every day | ORAL | 1 refills | Status: DC

## 2021-12-17 NOTE — Patient Instructions (Signed)
It was great to see you!  Please schedule mammogram  Follow-up in 3 months for zoloft follow-up; increase to 100 mg daily today  Please go to the lab for blood work.   Our office will call you with your results unless you have chosen to receive results via MyChart.  If your blood work is normal we will follow-up each year for physicals and as scheduled for chronic medical problems.  If anything is abnormal we will treat accordingly and get you in for a follow-up.  Take care,  Aldona Bar

## 2021-12-17 NOTE — Progress Notes (Signed)
Subjective:    [name] is a 46 y.o. female and is here for a comprehensive physical exam.   HPI  Health Maintenance Due  Topic Date Due   MAMMOGRAM  11/09/2014   COVID-19 Vaccine (4 - Booster for Pfizer series) 02/08/2021   PAP SMEAR-Modifier  06/29/2021    Acute Concerns: None reported at this time.   Chronic Issues: Anxiety/Depression Jacqueline Jimenez is currently compliant with taking Zoloft 50 mg daily and ativan 0.5 mg as needed with no adverse effects. Although she has been compliant, she still feels an increased amount of anxiety. Pt did recently move apartments, but states this state of anxiety has been the same even before the moving process. She has found that her sleep has improved which leads her to not needing to take the ativan often. Jacqueline Jimenez is interested in increasing her medication dosage. Apart from this she is tolerating well. Denies SI/HI.   Bilateral Hand Pain Since previous visit on 09/10/21, Jacqueline Jimenez has found that her right hand pain has improved. Despite this she is still experiencing the most stiffness  around both of her index fingers. She had trialed the meloxicam 15 mg daily and found it beneficial but hasn't needed to take it as often. Denies swelling or redness.   Health Maintenance: Immunizations -- Covid- UTD Influenza- Due Tdap- UTD;2019 Colonoscopy -- N/A Mammogram -- Will update soon PAP -- UTD Bone Density -- N/A Diet -- Eats all food groups Sleep habits -- No concerns Exercise -- As able Weight -- Stable Mood -- Stable Weight history: Wt Readings from Last 10 Encounters:  12/17/21 158 lb 8 oz (71.9 kg)  09/10/21 162 lb (73.5 kg)  06/06/21 159 lb 4 oz (72.2 kg)  12/20/19 160 lb (72.6 kg)  06/29/18 155 lb 12.8 oz (70.7 kg)  02/26/18 160 lb 6.4 oz (72.8 kg)  11/24/17 152 lb 12.8 oz (69.3 kg)  07/02/17 160 lb 6.4 oz (72.8 kg)  02/04/17 160 lb 12.8 oz (72.9 kg)  03/11/16 154 lb (69.9 kg)   Body mass index is 26.79 kg/m. Patient's last  menstrual period was 12/08/2021 (approximate). Alcohol use:  reports that she does not currently use alcohol. Tobacco use:  Tobacco Use: Medium Risk   Smoking Tobacco Use: Former   Smokeless Tobacco Use: Never   Passive Exposure: Not on file     Depression screen [facility] 2/9 12/17/2021  Decreased Interest 1  Down, Depressed, Hopeless 2  PHQ - 2 Score 3  Altered sleeping 1  Tired, decreased energy 1  Change in appetite 2  Feeling bad or failure about yourself  2  Trouble concentrating 1  Moving slowly or fidgety/restless 0  Suicidal thoughts 0  PHQ-9 Score 10  Difficult doing work/chores Somewhat difficult  Some encounter information is confidential and restricted. Go to Review Flowsheets activity to see all data.     Other providers/specialists: Patient Care Team: [name], [place] as PCP - General (Physician Assistant)    PMHx, SurgHx, SocialHx, Medications, and Allergies were reviewed in the Visit Navigator and updated as appropriate.   Past Medical History:  Diagnosis Date   Allergy    seasonal   Anxiety    Arthritis    Blood type, Rh negative    Depression    History of chicken pox    PID (pelvic inflammatory disease)    require hospitalizaiton at age 55   Pyloric stenosis    as an infant     Past Surgical History:  Procedure  Laterality Date   APPENDECTOMY     calf augmentation     CESAREAN SECTION     club foot correction     DILATION AND CURETTAGE OF UTERUS     HERNIA REPAIR     RHINOPLASTY       Family History  Problem Relation Age of Onset   Asthma Mother    Breast cancer Mother 77       survivor   Hypertension Father    Diabetes Maternal Grandmother    Breast cancer Maternal Grandmother 91   Breast cancer Other        unknown age and pased away    Social History   Tobacco Use   Smoking status: Former    Packs/day: 0.25    Years: 11.00    Pack years: 2.75    Types: Cigarettes    Quit date: 2006    Years since quitting: 17.1    Smokeless tobacco: Never  Vaping Use   Vaping Use: Never used  Substance Use Topics   Alcohol use: Not Currently    Comment: Sober times 2 days    Drug use: Not Currently    Types: Other-see comments    Comment: Opiates    Review of Systems:   Review of Systems  Constitutional:  Negative for chills, fever, malaise/fatigue and weight loss.  HENT:  Negative for hearing loss, sinus pain and sore throat.   Respiratory:  Negative for cough and hemoptysis.   Cardiovascular:  Negative for chest pain, palpitations, leg swelling and PND.  Gastrointestinal:  Negative for abdominal pain, constipation, diarrhea, heartburn, nausea and vomiting.  Genitourinary:  Negative for dysuria, frequency and urgency.  Musculoskeletal:  Negative for back pain, myalgias and neck pain.  Skin:  Negative for itching and rash.  Neurological:  Negative for dizziness, tingling, seizures and headaches.  Endo/Heme/Allergies:  Negative for polydipsia.  Psychiatric/Behavioral:  Negative for depression. The patient is not nervous/anxious.    Objective:   BP 130/80 (BP Location: Left Arm, Patient Position: Sitting, Cuff Size: Normal)    Pulse 92    Temp 98.3 F (36.8 C) (Temporal)    Ht 5' 4.5" (1.638 m)    Wt 158 lb 8 oz (71.9 kg)    LMP 12/08/2021 (Approximate)    SpO2 95%    BMI 26.79 kg/m  Body mass index is 26.79 kg/m.   General Appearance:    Alert, cooperative, no distress, appears stated age  Head:    Normocephalic, without obvious abnormality, atraumatic  Eyes:    PERRL, conjunctiva/corneas clear, EOM's intact, fundi    benign, both eyes  Ears:    Normal TM's and external ear canals, both ears  Nose:   Nares normal, septum midline, mucosa normal, no drainage    or sinus tenderness  Throat:   Lips, mucosa, and tongue normal; teeth and gums normal  Neck:   Supple, symmetrical, trachea midline, no adenopathy;    thyroid:  no enlargement/tenderness/nodules; no carotid   bruit or JVD  Back:     Symmetric,  no curvature, ROM normal, no CVA tenderness  Lungs:     Clear to auscultation bilaterally, respirations unlabored  Chest Wall:    No tenderness or deformity   Heart:    Regular rate and rhythm, S1 and S2 normal, no murmur, rub or gallop  Breast Exam:    Deferred  Abdomen:     Soft, non-tender, bowel sounds active all four quadrants,    no masses, no  organomegaly  Genitalia:    Normal female without lesion, discharge or tenderness  Extremities:   Extremities normal, atraumatic, no cyanosis or edema  Pulses:   2+ and symmetric all extremities  Skin:   Skin color, texture, turgor normal, no rashes or lesions  Lymph nodes:   Cervical, supraclavicular, and axillary nodes normal  Neurologic:   CNII-XII intact, normal strength, sensation and reflexes    throughout    Assessment/Plan:   Routine physical examination Today patient counseled on age appropriate routine health concerns for screening and prevention, each reviewed and up to date or declined. Immunizations reviewed and up to date or declined. Labs ordered and reviewed. Risk factors for depression reviewed and negative. Hearing function and visual acuity are intact. ADLs screened and addressed as needed. Functional ability and level of safety reviewed and appropriate. Education, counseling and referrals performed based on assessed risks today. Patient provided with a copy of personalized plan for preventive services.   Anxious Depression Uncontrolled No red flags; Denies SI/HI Increase Zoloft to 100 mg daily  Continue Ativan 0.5 mg as needed I advised patient that if they develop any SI, to tell someone immediately and seek medical attention Follow up in 3 months, sooner if concerns   Pap smear for cervical cancer screening Completed today   Encounter for lipid screening for cardiovascular disease Update lipid panel/profile, will start medication as indicated by results    Overweight Encouraged patient to participate in  healthy eating and regular exercise    Screening examination for STD (sexually transmitted disease) - HIV Antibody  - RPR   Patient Counseling: [x]    Nutrition: Stressed importance of moderation in sodium/caffeine intake, saturated fat and cholesterol, caloric balance, sufficient intake of fresh fruits, vegetables, fiber, calcium, iron, and 1 mg of folate supplement per day (for females capable of pregnancy).  [x]    Stressed the importance of regular exercise.   [x]    Substance Abuse: Discussed cessation/primary prevention of tobacco, alcohol, or other drug use; driving or other dangerous activities under the influence; availability of treatment for abuse.   [x]    Injury prevention: Discussed safety belts, safety helmets, smoke detector, smoking near bedding or upholstery.   [x]    Sexuality: Discussed sexually transmitted diseases, partner selection, use of condoms, avoidance of unintended pregnancy  and contraceptive alternatives.  [x]    Dental health: Discussed importance of regular tooth brushing, flossing, and dental visits.  [x]    Health maintenance and immunizations reviewed. Please refer to Health maintenance section.   I,Havlyn C Ratchford,acting as a Education administrator for [employer], PA.,have documented all relevant documentation on the behalf of [name], PA,as directed by  [name], PA while in the presence of [name], [place].  I, [name], [place], have reviewed all documentation for this visit. The documentation on 12/17/21 for the exam, diagnosis, procedures, and orders are all accurate and complete.   [name], PA-C [place]

## 2021-12-18 LAB — CERVICOVAGINAL ANCILLARY ONLY
Chlamydia: NEGATIVE
Comment: NEGATIVE
Comment: NEGATIVE
Comment: NORMAL
Neisseria Gonorrhea: NEGATIVE
Trichomonas: NEGATIVE

## 2021-12-18 LAB — HIV ANTIBODY (ROUTINE TESTING W REFLEX): HIV 1&2 Ab, 4th Generation: NONREACTIVE

## 2021-12-18 LAB — RPR: RPR Ser Ql: NONREACTIVE

## 2021-12-19 LAB — CYTOLOGY - PAP
Adequacy: ABSENT
Comment: NEGATIVE
Diagnosis: NEGATIVE
High risk HPV: NEGATIVE

## 2022-02-11 ENCOUNTER — Other Ambulatory Visit: Payer: Self-pay | Admitting: Physician Assistant

## 2022-02-11 DIAGNOSIS — Z1231 Encounter for screening mammogram for malignant neoplasm of breast: Secondary | ICD-10-CM

## 2022-02-15 ENCOUNTER — Ambulatory Visit (HOSPITAL_BASED_OUTPATIENT_CLINIC_OR_DEPARTMENT_OTHER): Payer: 59 | Admitting: Radiology

## 2022-02-22 ENCOUNTER — Encounter (HOSPITAL_BASED_OUTPATIENT_CLINIC_OR_DEPARTMENT_OTHER): Payer: Self-pay | Admitting: Radiology

## 2022-02-22 ENCOUNTER — Ambulatory Visit (HOSPITAL_BASED_OUTPATIENT_CLINIC_OR_DEPARTMENT_OTHER)
Admission: RE | Admit: 2022-02-22 | Discharge: 2022-02-22 | Disposition: A | Discharge status: Home / Self Care | Payer: 59 | Source: Ambulatory Visit | Attending: Physician Assistant | Admitting: Physician Assistant

## 2022-02-22 DIAGNOSIS — Z1231 Encounter for screening mammogram for malignant neoplasm of breast: Secondary | ICD-10-CM | POA: Diagnosis present

## 2022-03-15 ENCOUNTER — Encounter: Payer: Self-pay | Admitting: Physician Assistant

## 2022-03-15 ENCOUNTER — Ambulatory Visit (INDEPENDENT_AMBULATORY_CARE_PROVIDER_SITE_OTHER): Payer: 59 | Admitting: Physician Assistant

## 2022-03-15 VITALS — BP 140/80 | HR 66 | Temp 98.0°F | Ht 64.5 in | Wt 160.2 lb

## 2022-03-15 DIAGNOSIS — R1902 Left upper quadrant abdominal swelling, mass and lump: Secondary | ICD-10-CM | POA: Diagnosis not present

## 2022-03-15 DIAGNOSIS — F418 Other specified anxiety disorders: Secondary | ICD-10-CM | POA: Diagnosis not present

## 2022-03-15 MED ORDER — SERTRALINE HCL 100 MG PO TABS: 100.0000 mg | ORAL_TABLET | Freq: Every day | ORAL | 1 refills | Status: DC

## 2022-03-15 NOTE — Progress Notes (Signed)
Jacqueline Jimenez is a 46 y.o. female here for a follow up of a pre-existing problem.  History of Present Illness:   Chief Complaint  Patient presents with   Anxiety   Depression    Pt is taking Zoloft 100 mg    HPI  Anxiety/Depression Patient here to do 3 month zoloft follow up. She is currently compliant with taking Zoloft 100 mg daily and Ativan 0.5 mg as needed. She is tolerating her medication well with no adverse side effects. She reports her anxiety has improved. She moved to new apartments about 3 months ago and is now working full time. She notes that her sleep has improved. Her sleep is better during the weekends since she is not working. She takes ativan rarely, typically if she has been without sleep for awhile. She is managing well. Denies SI/HI.    Abdominal pain Patient has been experiencing some abdominal pain under her rib cage area. This has been going on for couple of months. She has had blood work done about 3 months ago which was normal. She has had CT scan of abdomen in 2018 which did showed hepatic steatosis at that time. She would like this to be further evaluated and she is concerned. She has an area of fullness/mass? That has been present for a few months. Denies bowel changes or unintentional weight changes.     Past Medical History:  Diagnosis Date   Allergy    seasonal   Anxiety    Arthritis    Blood type, Rh negative    Depression    History of chicken pox    PID (pelvic inflammatory disease)    require hospitalizaiton at age 71   Pyloric stenosis    as an infant     Social History   Tobacco Use   Smoking status: Former    Packs/day: 0.25    Years: 11.00    Pack years: 2.75    Types: Cigarettes    Quit date: 2006    Years since quitting: 17.4   Smokeless tobacco: Never  Vaping Use   Vaping Use: Never used  Substance Use Topics   Alcohol use: Not Currently    Comment: Sober times 2 days    Drug use: Not Currently    Types: Other-see  comments    Comment: Opiates    Past Surgical History:  Procedure Laterality Date   APPENDECTOMY     calf augmentation     CESAREAN SECTION     club foot correction     DILATION AND CURETTAGE OF UTERUS     HERNIA REPAIR     RHINOPLASTY      Family History  Problem Relation Age of Onset   Asthma Mother    Breast cancer Mother 40       survivor   Hypertension Father    Diabetes Maternal Grandmother    Breast cancer Maternal Grandmother 64   Breast cancer Other        unknown age and pased away    No Known Allergies  Current Medications:   Current Outpatient Medications:    amoxicillin (AMOXIL) 250 MG/5ML suspension, Take 250 mg by mouth 3 (three) times daily., Disp: , Rfl:    fluticasone (FLONASE) 50 MCG/ACT nasal spray, Place 1 spray into both nostrils daily., Disp: , Rfl:    ibuprofen (ADVIL,MOTRIN) 200 MG tablet, Take 400 mg by mouth every 6 (six) hours as needed for moderate pain., Disp: , Rfl:  levocetirizine (XYZAL) 5 MG tablet, Take 1 tablet (5 mg total) by mouth every evening. (Patient taking differently: Take 5 mg by mouth every morning.), Disp: 30 tablet, Rfl: 5   LORazepam (ATIVAN) 0.5 MG tablet, Take 0.5 mg tablet as needed up to daily for anxiety, Disp: 20 tablet, Rfl: 0   methadone (DOLOPHINE) 10 MG/ML solution, Take 120 mg by mouth daily. [facility], Disp: , Rfl:    montelukast (SINGULAIR) 10 MG tablet, Take 1 tablet (10 mg total) by mouth at bedtime., Disp: 90 tablet, Rfl: 3   sertraline (ZOLOFT) 100 MG tablet, Take 1 tablet (100 mg total) by mouth daily., Disp: 90 tablet, Rfl: 1   Review of Systems:   ROS Negative unless otherwise specified per HPI.   Vitals:   Vitals:   03/15/22 1400  BP: 140/80  Pulse: 66  Temp: 98 F (36.7 C)  TempSrc: Oral  SpO2: 96%  Weight: 160 lb 4 oz (72.7 kg)  Height: 5' 4.5" (1.638 m)     Body mass index is 27.08 kg/m.  Physical Exam:   Physical Exam Vitals and nursing note reviewed.   Constitutional:      General: She is not in acute distress.    Appearance: She is well-developed. She is not ill-appearing or toxic-appearing.  Cardiovascular:     Rate and Rhythm: Normal rate and regular rhythm.     Pulses: Normal pulses.     Heart sounds: Normal heart sounds, S1 normal and S2 normal.  Pulmonary:     Effort: Pulmonary effort is normal.     Breath sounds: Normal breath sounds.  Abdominal:     Comments: Area of abdominal fullness in LUQ / epigastric area over her prior abdominal scar  Skin:    General: Skin is warm and dry.  Neurological:     Mental Status: She is alert.     GCS: GCS eye subscore is 4. GCS verbal subscore is 5. GCS motor subscore is 6.  Psychiatric:        Speech: Speech normal.        Behavior: Behavior normal. Behavior is cooperative.    Assessment and Plan:   Left upper quadrant abdominal mass Unclear etiology, possible scar tissue from prior surgery? Will obtain u/s for further evaluation  Blood work reviewed from last visit this was normal Further work-up based on results/sx  Anxious depression Well controlled Continue zoloft 100 mg daily PDMP reviewed, no red flags, will refill Ativan 0.5 mg when needed Follow-up in 6 months, sooner if concerns  I,Savera Zaman,acting as a scribe for [employer], PA.,have documented all relevant documentation on the behalf of [name], PA,as directed by  [name], PA while in the presence of [name], [place].   I, [name], [place], have reviewed all documentation for this visit. The documentation on 03/15/22 for the exam, diagnosis, procedures, and orders are all accurate and complete.  [name], PA-C

## 2022-03-15 NOTE — Patient Instructions (Signed)
It was great to see you!  You will be contacted about your ultrasound -- if you do not hear anything in a week or so, let me know  Continue zoloft 100 mg daily  Take care,  [name] PA-C

## 2022-03-25 NOTE — Progress Notes (Signed)
Jaslyn H Jutras is a 46 y.o. female here for a {New prob or follow up:31724}.  SCRIBE STATEMENT  History of Present Illness:   No chief complaint on file.   HPI  Back Pain Patient complain of back pain that has been onset for the past ***. Located on right lower side. States pain radiates down to her hip and leg. Worse with certain motions. She reports she feel this when standing or siting.   Past Medical History:  Diagnosis Date   Allergy    seasonal   Anxiety    Arthritis    Blood type, Rh negative    Depression    History of chicken pox    PID (pelvic inflammatory disease)    require hospitalizaiton at age 28   Pyloric stenosis    as an infant     Social History   Tobacco Use   Smoking status: Former    Packs/day: 0.25    Years: 11.00    Pack years: 2.75    Types: Cigarettes    Quit date: 2006    Years since quitting: 17.4   Smokeless tobacco: Never  Vaping Use   Vaping Use: Never used  Substance Use Topics   Alcohol use: Not Currently    Comment: Sober times 2 days    Drug use: Not Currently    Types: Other-see comments    Comment: Opiates    Past Surgical History:  Procedure Laterality Date   APPENDECTOMY     calf augmentation     CESAREAN SECTION     club foot correction     DILATION AND CURETTAGE OF UTERUS     HERNIA REPAIR     RHINOPLASTY      Family History  Problem Relation Age of Onset   Asthma Mother    Breast cancer Mother 63       survivor   Hypertension Father    Diabetes Maternal Grandmother    Breast cancer Maternal Grandmother 63   Breast cancer Other        unknown age and pased away    No Known Allergies  Current Medications:   Current Outpatient Medications:    amoxicillin (AMOXIL) 250 MG/5ML suspension, Take 250 mg by mouth 3 (three) times daily., Disp: , Rfl:    fluticasone (FLONASE) 50 MCG/ACT nasal spray, Place 1 spray into both nostrils daily., Disp: , Rfl:    ibuprofen (ADVIL,MOTRIN) 200 MG tablet, Take 400 mg  by mouth every 6 (six) hours as needed for moderate pain., Disp: , Rfl:    levocetirizine (XYZAL) 5 MG tablet, Take 1 tablet (5 mg total) by mouth every evening. (Patient taking differently: Take 5 mg by mouth every morning.), Disp: 30 tablet, Rfl: 5   LORazepam (ATIVAN) 0.5 MG tablet, Take 0.5 mg tablet as needed up to daily for anxiety, Disp: 20 tablet, Rfl: 0   methadone (DOLOPHINE) 10 MG/ML solution, Take 120 mg by mouth daily. [facility], Disp: , Rfl:    montelukast (SINGULAIR) 10 MG tablet, Take 1 tablet (10 mg total) by mouth at bedtime., Disp: 90 tablet, Rfl: 3   sertraline (ZOLOFT) 100 MG tablet, Take 1 tablet (100 mg total) by mouth daily., Disp: 90 tablet, Rfl: 1   Review of Systems:   ROS Negative unless otherwise specified per HPI.   Vitals:   There were no vitals filed for this visit.   There is no height or weight on file to calculate BMI.  Physical  Exam:   Physical Exam  Assessment and Plan:   @DIAGLIST @    I,Savera Zaman,acting as a scribe for [employer], PA.,have documented all relevant documentation on the behalf of [name], PA,as directed by  [name], PA while in the presence of [name], [place].   ***  [name], PA-C

## 2022-03-26 ENCOUNTER — Encounter: Payer: Self-pay | Admitting: Physician Assistant

## 2022-03-26 ENCOUNTER — Ambulatory Visit (INDEPENDENT_AMBULATORY_CARE_PROVIDER_SITE_OTHER): Payer: 59 | Admitting: Physician Assistant

## 2022-03-26 ENCOUNTER — Ambulatory Visit (INDEPENDENT_AMBULATORY_CARE_PROVIDER_SITE_OTHER)
Admission: RE | Admit: 2022-03-26 | Discharge: 2022-03-26 | Disposition: A | Discharge status: Home / Self Care | Payer: 59 | Source: Ambulatory Visit | Attending: Physician Assistant | Admitting: Physician Assistant

## 2022-03-26 VITALS — BP 124/80 | HR 73 | Temp 97.9°F | Ht 64.5 in | Wt 161.4 lb

## 2022-03-26 DIAGNOSIS — M5441 Lumbago with sciatica, right side: Secondary | ICD-10-CM

## 2022-03-26 DIAGNOSIS — M25551 Pain in right hip: Secondary | ICD-10-CM | POA: Diagnosis not present

## 2022-03-26 MED ORDER — PREDNISONE 50 MG PO TABS: ORAL_TABLET | ORAL | 0 refills | Status: DC

## 2022-03-26 NOTE — Patient Instructions (Addendum)
It was great to see you!  Start prednisone 50 mg daily x 5 days. This will replace ibuprofen and meloxicam. After completion of prednisone, if needed, may take ibuprofen or meloxicam. Trial exercises as able If you do not get any relief, have new symptoms, or want more work-up for this, I will refer you to Sports Medicine.  An order for an xray has been put in for you. To get your xray, you can walk in at the [facility] location without a scheduled appointment.  The address is [address]. It is across the street from [place] is located in the basement.  Hours of operation are M-F 8:30am to 5:00pm. Please note that they are closed for lunch between 12:30 and 1:00pm.  Take care,  [name] PA-C

## 2022-03-30 ENCOUNTER — Ambulatory Visit (HOSPITAL_BASED_OUTPATIENT_CLINIC_OR_DEPARTMENT_OTHER)
Admission: RE | Admit: 2022-03-30 | Discharge: 2022-03-30 | Disposition: A | Discharge status: Home / Self Care | Payer: 59 | Source: Ambulatory Visit | Attending: Physician Assistant | Admitting: Physician Assistant

## 2022-03-30 DIAGNOSIS — R1902 Left upper quadrant abdominal swelling, mass and lump: Secondary | ICD-10-CM | POA: Insufficient documentation

## 2022-04-01 ENCOUNTER — Encounter: Payer: Self-pay | Admitting: Physician Assistant

## 2022-04-01 ENCOUNTER — Other Ambulatory Visit: Payer: Self-pay | Admitting: *Deleted

## 2022-04-01 DIAGNOSIS — K760 Fatty (change of) liver, not elsewhere classified: Secondary | ICD-10-CM | POA: Insufficient documentation

## 2022-04-01 DIAGNOSIS — M5441 Lumbago with sciatica, right side: Secondary | ICD-10-CM

## 2022-04-09 NOTE — Progress Notes (Deleted)
    Benito Mccreedy D.[place] Phone: [phone]   Assessment and Plan:     There are no diagnoses linked to this encounter.  ***   Pertinent previous records reviewed include ***   Follow Up: ***     Subjective:   I, Narcissa Melder, am serving as a Education administrator for Doctor Glennon Mac  Chief Complaint: low back pain   HPI:  04/10/2022 Patient is a 46 year old female complaining of low back pain. Patient states  Relevant Historical Information: ***  Additional pertinent review of systems negative.   Current Outpatient Medications:    fluticasone (FLONASE) 50 MCG/ACT nasal spray, Place 1 spray into both nostrils daily., Disp: , Rfl:    ibuprofen (ADVIL,MOTRIN) 200 MG tablet, Take 400 mg by mouth every 6 (six) hours as needed for moderate pain., Disp: , Rfl:    levocetirizine (XYZAL) 5 MG tablet, Take 1 tablet (5 mg total) by mouth every evening. (Patient taking differently: Take 5 mg by mouth every morning.), Disp: 30 tablet, Rfl: 5   LORazepam (ATIVAN) 0.5 MG tablet, Take 0.5 mg tablet as needed up to daily for anxiety, Disp: 20 tablet, Rfl: 0   methadone (DOLOPHINE) 10 MG/ML solution, Take 120 mg by mouth daily. [facility], Disp: , Rfl:    montelukast (SINGULAIR) 10 MG tablet, Take 1 tablet (10 mg total) by mouth at bedtime., Disp: 90 tablet, Rfl: 3   predniSONE (DELTASONE) 50 MG tablet, Take 1 tablet daily, Disp: 5 tablet, Rfl: 0   sertraline (ZOLOFT) 100 MG tablet, Take 1 tablet (100 mg total) by mouth daily., Disp: 90 tablet, Rfl: 1   Objective:     There were no vitals filed for this visit.    There is no height or weight on file to calculate BMI.    Physical Exam:    ***   Electronically signed by:  Benito Mccreedy D.[name] 7:50 AM 04/09/22

## 2022-04-10 ENCOUNTER — Ambulatory Visit: Payer: 59 | Admitting: Sports Medicine

## 2022-04-10 NOTE — Progress Notes (Signed)
Benito Mccreedy D.[place] Phone: [phone]   Assessment and Plan:     1. Chronic bilateral low back pain without sciatica 2. Somatic dysfunction of lumbar region 3. Somatic dysfunction of pelvic region 4. Somatic dysfunction of sacral region -Chronic with exacerbation, initial sports medicine visit - Acute flare of low back pain that is overall been well controlled with prednisone, and patient looking for ways to completely resolve pain as well as prevent pain from returning - Recommend obtaining good cushioned inserts for shoes when patient has to stand for long periods of time at work.  [facility] may be a good place patient could look - Tylenol/NSAIDs as needed for pain relief - Start HEP for low back - Patient has received significant relief with OMT in the past.  Elects for repeat OMT today.  Tolerated well per note below. - Decision today to treat with OMT was based on Physical Exam  After verbal consent patient was treated with HVLA (high velocity low amplitude), ME (muscle energy), FPR (flex positional release), ST (soft tissue), PC/PD (Pelvic Compression/ Pelvic Decompression) techniques in sacrum, lumbar, and pelvic areas. Patient tolerated the procedure well with improvement in symptoms.  Patient educated on potential side effects of soreness and recommended to rest, hydrate, and use Tylenol as needed for pain control.    Pertinent previous records reviewed include lumbar spine x-ray 03/26/2022, right hip x-ray 03/26/2022, family medicine note 03/26/2022   Follow Up: 4 weeks for reevaluation.  Could consider repeat OMT if patient found it beneficial   Subjective:   I, [name], am serving as a Education administrator for Doctor Glennon Mac  Chief Complaint: low back pain   HPI:   04/11/2022 Patient is a 46 year old female complaining of low back pain. Patient states back pain that has been onset for the  past 1 week. Located on right lower back. States pain radiates down to her hip and right leg. 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 She has been on her feet for long periods of time and thinks this could be contributing. States pain started to radiates down into hip and leg on Thursday. She has had increased pain on Friday which she rate 9 out of 10 on a scale. Worse with certain motions. She reports she feel this pain when standing or siting. She has tried Ibuprofen 800 mg daily to help alleviate her symptoms. Symptoms hurts worse with standing. It is tender to touch. Symptoms started to get improved during the weekend. The prednisone and a weekend off work really helped with the pain. Patient states she works 12 hour shifts and is on her feet all day. Wondering if she need lifts or different shoes or OMT.   Relevant Historical Information: None pertinent  Additional pertinent review of systems negative.   Current Outpatient Medications:    fluticasone (FLONASE) 50 MCG/ACT nasal spray, Place 1 spray into both nostrils daily., Disp: , Rfl:    ibuprofen (ADVIL,MOTRIN) 200 MG tablet, Take 400 mg by mouth every 6 (six) hours as needed for moderate pain., Disp: , Rfl:    levocetirizine (XYZAL) 5 MG tablet, Take 1 tablet (5 mg total) by mouth every evening. (Patient taking differently: Take 5 mg by mouth every morning.), Disp: 30 tablet, Rfl: 5   LORazepam (ATIVAN) 0.5 MG tablet, Take 0.5 mg tablet as needed up to daily  for anxiety, Disp: 20 tablet, Rfl: 0   methadone (DOLOPHINE) 10 MG/ML solution, Take 120 mg by mouth daily. [facility], Disp: , Rfl:    montelukast (SINGULAIR) 10 MG tablet, Take 1 tablet (10 mg total) by mouth at bedtime., Disp: 90 tablet, Rfl: 3   sertraline (ZOLOFT) 100 MG tablet, Take 1 tablet (100 mg total) by mouth daily., Disp: 90 tablet, Rfl: 1   predniSONE (DELTASONE) 50 MG tablet, Take 1 tablet daily (Patient  not taking: Reported on 04/11/2022), Disp: 5 tablet, Rfl: 0   Objective:     Vitals:   04/11/22 1446  BP: 120/60  Pulse: 86  SpO2: 96%  Weight: 160 lb (72.6 kg)  Height: 5' 4.5" (1.638 m)      Body mass index is 27.04 kg/m.    Physical Exam:    General: Well-appearing, cooperative, sitting comfortably in no acute distress.   OMT Physical Exam:  ASIS Compression Test: Positive Right Sacrum: Negative sphinx, NTTP bilateral sacral base Lumbar: TTP paraspinal, L1-3 RRSL Pelvis: Right anterior innominate    Electronically signed by:  Benito Mccreedy D.[name] 4:35 PM 04/11/22

## 2022-04-11 ENCOUNTER — Telehealth: Payer: Self-pay | Admitting: Physician Assistant

## 2022-04-11 ENCOUNTER — Ambulatory Visit (INDEPENDENT_AMBULATORY_CARE_PROVIDER_SITE_OTHER): Payer: 59 | Admitting: Sports Medicine

## 2022-04-11 VITALS — BP 120/60 | HR 86 | Ht 64.5 in | Wt 160.0 lb

## 2022-04-11 DIAGNOSIS — G8929 Other chronic pain: Secondary | ICD-10-CM

## 2022-04-11 DIAGNOSIS — M9904 Segmental and somatic dysfunction of sacral region: Secondary | ICD-10-CM

## 2022-04-11 DIAGNOSIS — M545 Low back pain, unspecified: Secondary | ICD-10-CM

## 2022-04-11 DIAGNOSIS — M9905 Segmental and somatic dysfunction of pelvic region: Secondary | ICD-10-CM

## 2022-04-11 DIAGNOSIS — M9903 Segmental and somatic dysfunction of lumbar region: Secondary | ICD-10-CM

## 2022-04-11 NOTE — Patient Instructions (Addendum)
Good to see you  Low back HEP Tylenol and NSAIDs as needed  Recommend getting cushioned inserts [facility]  4 week follow up

## 2022-04-11 NOTE — Telephone Encounter (Signed)
Patient states she had a completed US that Sam ordered on 6/10.  States she has not heard anything back in regard.  States she is scared after reading the results in [place].  Is requesting a call back as soon as possible.

## 2022-05-08 NOTE — Progress Notes (Signed)
Jacqueline Jimenez D.[place] Phone: [phone]   Assessment and Plan:     1. Chronic bilateral low back pain with right-sided sciatica 2. Strain of right piriformis muscle, subsequent encounter 3. Somatic dysfunction of lumbar region 4. Somatic dysfunction of pelvic region 5. Somatic dysfunction of sacral region -Chronic with exacerbation, subsequent visit - Continued flare of pain over gluteal musculature and piriformis likely causing intermittent sciatica symptoms down right leg - Patient only performed HEP minimally, so recommend regular HEP - Start physical therapy for sciatica and piriformis - Patient may continue meloxicam for an additional 2 weeks - Patient did not get inserts since last visit.  Continue recommend cushioned insert that patient can use for long periods of standing. - Ambulatory referral to Physical Therapy  - Patient has received significant relief with OMT in the past.  Elects for repeat OMT today.  Tolerated well per note below. - Decision today to treat with OMT was based on Physical Exam   After verbal consent patient was treated with HVLA (high velocity low amplitude), ME (muscle energy), FPR (flex positional release), ST (soft tissue), PC/PD (Pelvic Compression/ Pelvic Decompression) techniques in sacrum, lumbar, and pelvic areas. Patient tolerated the procedure well with improvement in symptoms.  Patient educated on potential side effects of soreness and recommended to rest, hydrate, and use Tylenol as needed for pain control.   Pertinent previous records reviewed include none   Follow Up: 4 weeks for reevaluation.  Could consider repeat OMT versus piriformis injection if no improvement or worsening of symptoms   Subjective:    I, Jacqueline Jimenez, am serving as a Education administrator for Jacqueline Jimenez   Chief Complaint: low back pain    HPI:    04/11/2022 Patient is a 46 year old female  complaining of low back pain. Patient states back pain that has been onset for the past 1 week. Located on right lower back. States pain radiates down to her hip and right leg. 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 She has been on her feet for long periods of time and thinks this could be contributing. States pain started to radiates down into hip and leg on Thursday. She has had increased pain on Friday which she rate 9 out of 10 on a scale. Worse with certain motions. She reports she feel this pain when standing or siting. She has tried Ibuprofen 800 mg daily to help alleviate her symptoms. Symptoms hurts worse with standing. It is tender to touch. Symptoms started to get improved during the weekend. The prednisone and a weekend off work really helped with the pain. Patient states she works 12 hour shifts and is on her feet all day. Wondering if she need lifts or different shoes or OMT.   05/09/2022 Patient states that she is hurting at work    Relevant Historical Information: None pertinent  Additional pertinent review of systems negative.  Current Outpatient Medications  Medication Sig Dispense Refill   fluticasone (FLONASE) 50 MCG/ACT nasal spray Place 1 spray into both nostrils daily.     ibuprofen (ADVIL,MOTRIN) 200 MG tablet Take 400 mg by mouth every 6 (six) hours as needed for moderate pain.     levocetirizine (XYZAL) 5 MG tablet Take 1 tablet (5 mg total) by mouth every evening. (Patient taking differently: Take 5 mg by mouth every morning.) 30 tablet 5  LORazepam (ATIVAN) 0.5 MG tablet Take 0.5 mg tablet as needed up to daily for anxiety 20 tablet 0   methadone (DOLOPHINE) 10 MG/ML solution Take 120 mg by mouth daily. [facility]     montelukast (SINGULAIR) 10 MG tablet Take 1 tablet (10 mg total) by mouth at bedtime. 90 tablet 3   predniSONE (DELTASONE) 50 MG tablet Take 1 tablet daily 5 tablet 0   sertraline  (ZOLOFT) 100 MG tablet Take 1 tablet (100 mg total) by mouth daily. 90 tablet 1   No current facility-administered medications for this visit.      Objective:     Vitals:   05/09/22 1437  BP: 130/68  Pulse: 81  SpO2: 98%  Weight: 161 lb (73 kg)  Height: '5\' 4"'$  (1.626 m)      Body mass index is 27.64 kg/m.    Physical Exam:     General: Well-appearing, cooperative, sitting comfortably in no acute distress.   OMT Physical Exam:  ASIS Compression Test: Positive Right Sacrum: TTP bilateral sacral base, positive sphinx Lumbar: TTP paraspinal, L1-3 RRSL Pelvis: Right anterior innominate  Electronically signed by:  Jacqueline Jimenez D.[name] 2:58 PM 05/09/22

## 2022-05-09 ENCOUNTER — Ambulatory Visit (INDEPENDENT_AMBULATORY_CARE_PROVIDER_SITE_OTHER): Payer: 59 | Admitting: Sports Medicine

## 2022-05-09 VITALS — BP 130/68 | HR 81 | Ht 64.0 in | Wt 161.0 lb

## 2022-05-09 DIAGNOSIS — M9904 Segmental and somatic dysfunction of sacral region: Secondary | ICD-10-CM

## 2022-05-09 DIAGNOSIS — G8929 Other chronic pain: Secondary | ICD-10-CM

## 2022-05-09 DIAGNOSIS — M9905 Segmental and somatic dysfunction of pelvic region: Secondary | ICD-10-CM

## 2022-05-09 DIAGNOSIS — M5441 Lumbago with sciatica, right side: Secondary | ICD-10-CM | POA: Diagnosis not present

## 2022-05-09 DIAGNOSIS — S76311D Strain of muscle, fascia and tendon of the posterior muscle group at thigh level, right thigh, subsequent encounter: Secondary | ICD-10-CM

## 2022-05-09 DIAGNOSIS — M9903 Segmental and somatic dysfunction of lumbar region: Secondary | ICD-10-CM

## 2022-05-09 NOTE — Patient Instructions (Addendum)
Good to see you Continue meloxicam for 2 weeks  Continue HEP  PT referral  Recommend going [facility] to get inserts  4 week follow up
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
## 2022-05-28 ENCOUNTER — Other Ambulatory Visit: Payer: Self-pay

## 2022-05-28 ENCOUNTER — Encounter: Payer: Self-pay | Admitting: Physical Therapy

## 2022-05-28 ENCOUNTER — Ambulatory Visit: Payer: 59 | Attending: Sports Medicine | Admitting: Physical Therapy

## 2022-05-28 DIAGNOSIS — M6281 Muscle weakness (generalized): Secondary | ICD-10-CM | POA: Diagnosis present

## 2022-05-28 DIAGNOSIS — S76311D Strain of muscle, fascia and tendon of the posterior muscle group at thigh level, right thigh, subsequent encounter: Secondary | ICD-10-CM | POA: Diagnosis not present

## 2022-05-28 DIAGNOSIS — M5459 Other low back pain: Secondary | ICD-10-CM | POA: Diagnosis present

## 2022-05-28 DIAGNOSIS — G8929 Other chronic pain: Secondary | ICD-10-CM | POA: Insufficient documentation

## 2022-05-28 DIAGNOSIS — M5441 Lumbago with sciatica, right side: Secondary | ICD-10-CM | POA: Insufficient documentation

## 2022-05-28 NOTE — Patient Instructions (Addendum)
Access Code: [email or web address] URL: [email or web address] Date: 05/28/2022 Prepared by: Hilda Blades  Exercises - Clam with Resistance  - 1 x daily - 3 sets - 15 reps - Bridge  - 1 x daily - 3 sets - 10 reps - 5 seconds hold - Standing Glute Med Mobilization with Small Ball on Wall  - 1-2 x weekly - 3-5 minutes hold

## 2022-05-29 NOTE — Progress Notes (Signed)
Benito Mccreedy D.[place] Phone: [phone]   Assessment and Plan:     1. Chronic bilateral low back pain with right-sided sciatica 2. Strain of right piriformis muscle, subsequent encounter -Chronic with exacerbation, subsequent visit - Overall improvement in flare of pain over gluteal musculature and piriformis with radicular symptoms improving since previous office visit likely due to patient having no work over the past 1 week, obtaining inserts, and starting HEP - Continue physical therapy and home exercise program - Recommend using inserts whenever ambulatory for long periods of time - Discontinue meloxicam at this time and may use Tylenol/ibuprofen as needed for day-to-day pain relief - Patient has received significant relief with OMT in the past.  Elects for repeat OMT today.  Tolerated well per note below. - Decision today to treat with OMT was based on Physical Exam  After verbal consent patient was treated with HVLA (high velocity low amplitude), ME (muscle energy), FPR (flex positional release), ST (soft tissue), PC/PD (Pelvic Compression/ Pelvic Decompression) techniques in sacrum, lumbar, and pelvic areas. Patient tolerated the procedure well with improvement in symptoms.  Patient educated on potential side effects of soreness and recommended to rest, hydrate, and use Tylenol as needed for pain control.   3. Nevus  -Chronic, initial visit - Patient concerned over multiple nevi along forearms and is requesting establishing care with dermatology.  No red flag signs on physical exam, but agreed that patient should establish with dermatology for regular skin checks - Referral to dermatology  Pertinent previous records reviewed include none   Follow Up: 4 weeks for reevaluation.  Could repeat OMT if patient found it beneficial   Subjective:   I, [name], am serving as a Education administrator for Doctor  Glennon Mac   Chief Complaint: low back pain    HPI:    04/11/2022 Patient is a 46 year old female complaining of low back pain. Patient states back pain that has been onset for the past 1 week. Located on right lower back. States pain radiates down to her hip and right leg. 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 She has been on her feet for long periods of time and thinks this could be contributing. States pain started to radiates down into hip and leg on Thursday. She has had increased pain on Friday which she rate 9 out of 10 on a scale. Worse with certain motions. She reports she feel this pain when standing or siting. She has tried Ibuprofen 800 mg daily to help alleviate her symptoms. Symptoms hurts worse with standing. It is tender to touch. Symptoms started to get improved during the weekend. The prednisone and a weekend off work really helped with the pain. Patient states she works 12 hour shifts and is on her feet all day. Wondering if she need lifts or different shoes or OMT.    05/09/2022 Patient states that she is hurting at work    06/06/2022 Patient states she hasnt worked so she is feeling good  Relevant Historical Information: None pertinent  Additional pertinent review of systems negative.   Current Outpatient Medications:    fluticasone (FLONASE) 50 MCG/ACT nasal spray, Place 1 spray into both nostrils daily., Disp: , Rfl:    ibuprofen (ADVIL,MOTRIN) 200 MG tablet, Take 400 mg by mouth every 6 (six) hours as needed for moderate pain., Disp: , Rfl:  levocetirizine (XYZAL) 5 MG tablet, Take 1 tablet (5 mg total) by mouth every evening. (Patient taking differently: Take 5 mg by mouth every morning.), Disp: 30 tablet, Rfl: 5   LORazepam (ATIVAN) 0.5 MG tablet, Take 0.5 mg tablet as needed up to daily for anxiety, Disp: 20 tablet, Rfl: 0   methadone (DOLOPHINE) 10 MG/ML solution, Take 120 mg by mouth daily. [facility], Disp: , Rfl:    montelukast (SINGULAIR) 10 MG tablet, Take 1 tablet (10 mg total) by mouth at bedtime., Disp: 90 tablet, Rfl: 3   predniSONE (DELTASONE) 50 MG tablet, Take 1 tablet daily, Disp: 5 tablet, Rfl: 0   sertraline (ZOLOFT) 100 MG tablet, Take 1 tablet (100 mg total) by mouth daily., Disp: 90 tablet, Rfl: 1   Objective:     Vitals:   06/06/22 1340  Pulse: 86  SpO2: 96%  Weight: 163 lb (73.9 kg)  Height: '5\' 4"'$  (1.626 m)      Body mass index is 27.98 kg/m.    Physical Exam:    General: Well-appearing, cooperative, sitting comfortably in no acute distress.  Skin: Multiple nevi over bilateral forearms without concerning features   OMT Physical Exam:  ASIS Compression Test: Positive Right Sit from: Negative sphinx, NTTP bilateral sacral base Lumbar: TTP paraspinal, L1-3 RRSL Pelvis: Right anterior innominate    Electronically signed by:  Benito Mccreedy D.[name] 2:00 PM 06/06/22
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
## 2022-06-06 ENCOUNTER — Ambulatory Visit: Payer: 59 | Admitting: Physical Therapy

## 2022-06-06 ENCOUNTER — Encounter: Payer: Self-pay | Admitting: Physical Therapy

## 2022-06-06 ENCOUNTER — Other Ambulatory Visit: Payer: Self-pay

## 2022-06-06 ENCOUNTER — Ambulatory Visit (INDEPENDENT_AMBULATORY_CARE_PROVIDER_SITE_OTHER): Payer: 59 | Admitting: Sports Medicine

## 2022-06-06 VITALS — HR 86 | Ht 64.0 in | Wt 163.0 lb

## 2022-06-06 DIAGNOSIS — M5441 Lumbago with sciatica, right side: Secondary | ICD-10-CM | POA: Diagnosis not present

## 2022-06-06 DIAGNOSIS — M5459 Other low back pain: Secondary | ICD-10-CM | POA: Diagnosis not present

## 2022-06-06 DIAGNOSIS — D229 Melanocytic nevi, unspecified: Secondary | ICD-10-CM | POA: Diagnosis not present

## 2022-06-06 DIAGNOSIS — S76311D Strain of muscle, fascia and tendon of the posterior muscle group at thigh level, right thigh, subsequent encounter: Secondary | ICD-10-CM

## 2022-06-06 DIAGNOSIS — G8929 Other chronic pain: Secondary | ICD-10-CM | POA: Diagnosis not present

## 2022-06-06 DIAGNOSIS — M6281 Muscle weakness (generalized): Secondary | ICD-10-CM

## 2022-06-06 NOTE — Patient Instructions (Addendum)
Good to see you  Discontinue meloxicam  Continue PT and HEP Continue using inserts  Dermatology referral  4 week follow up for MSK

## 2022-06-10 ENCOUNTER — Other Ambulatory Visit: Payer: Self-pay | Admitting: Physician Assistant

## 2022-06-10 ENCOUNTER — Other Ambulatory Visit: Payer: Self-pay

## 2022-06-10 ENCOUNTER — Ambulatory Visit: Payer: 59 | Admitting: Physical Therapy

## 2022-06-10 ENCOUNTER — Encounter: Payer: Self-pay | Admitting: Physical Therapy

## 2022-06-10 DIAGNOSIS — M5459 Other low back pain: Secondary | ICD-10-CM

## 2022-06-10 DIAGNOSIS — M6281 Muscle weakness (generalized): Secondary | ICD-10-CM

## 2022-06-10 NOTE — Patient Instructions (Signed)
Access Code: [email or web address] URL: [email or web address] Date: 06/10/2022 Prepared by: Hilda Blades  Exercises - Clam with Resistance  - 1 x daily - 3 sets - 15 reps - Figure 4 Bridge  - 1 x daily - 3 sets - 10 reps - 3-5 seconds hold - Standing Glute Med Mobilization with Small Ball on Wall  - 1-2 x weekly - 3-5 minutes hold
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
## 2022-06-14 ENCOUNTER — Other Ambulatory Visit: Payer: Self-pay | Admitting: Physician Assistant

## 2022-06-14 MED ORDER — LORAZEPAM 0.5 MG PO TABS: ORAL_TABLET | ORAL | 0 refills | Status: DC

## 2022-06-14 NOTE — Telephone Encounter (Signed)
Pt requesting refill on Lorazepam 0.5 mg. Last OV 03/26/2022.

## 2022-06-14 NOTE — Telephone Encounter (Signed)
..   Encourage patient to contact the pharmacy for refills or they can request refills through [facility]:  Please schedule appointment if longer than 1 year  03/26/22  NEXT APPOINTMENT DATE:  MEDICATION:LORazepam (ATIVAN) 0.5 MG tablet  Is the patient out of medication? Yes  PHARMACY:  [facility] [place] Phone:  [phone]  Fax:  [phone]     Let patient know to contact pharmacy at the end of the day to make sure medication is ready.  Please notify patient to allow 48-72 hours to process

## 2022-06-19 ENCOUNTER — Encounter: Payer: Self-pay | Admitting: Physical Therapy

## 2022-06-19 ENCOUNTER — Ambulatory Visit: Payer: 59 | Admitting: Physical Therapy

## 2022-06-19 ENCOUNTER — Other Ambulatory Visit: Payer: Self-pay

## 2022-06-19 DIAGNOSIS — M5459 Other low back pain: Secondary | ICD-10-CM | POA: Diagnosis not present

## 2022-06-19 DIAGNOSIS — M6281 Muscle weakness (generalized): Secondary | ICD-10-CM
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
## 2022-07-04 ENCOUNTER — Ambulatory Visit: Payer: 59 | Admitting: Sports Medicine

## 2022-07-15 ENCOUNTER — Encounter: Payer: Self-pay | Admitting: *Deleted

## 2022-09-27 ENCOUNTER — Ambulatory Visit (INDEPENDENT_AMBULATORY_CARE_PROVIDER_SITE_OTHER): Payer: No Typology Code available for payment source | Admitting: Physician Assistant

## 2022-09-27 ENCOUNTER — Encounter: Payer: Self-pay | Admitting: Physician Assistant

## 2022-09-27 ENCOUNTER — Telehealth: Payer: Self-pay | Admitting: Physician Assistant

## 2022-09-27 VITALS — BP 135/88 | HR 73 | Temp 97.5°F | Ht 64.0 in | Wt 163.0 lb

## 2022-09-27 DIAGNOSIS — Z23 Encounter for immunization: Secondary | ICD-10-CM | POA: Diagnosis not present

## 2022-09-27 DIAGNOSIS — F418 Other specified anxiety disorders: Secondary | ICD-10-CM | POA: Diagnosis not present

## 2022-09-27 DIAGNOSIS — G8929 Other chronic pain: Secondary | ICD-10-CM

## 2022-09-27 DIAGNOSIS — J301 Allergic rhinitis due to pollen: Secondary | ICD-10-CM | POA: Diagnosis not present

## 2022-09-27 DIAGNOSIS — M5441 Lumbago with sciatica, right side: Secondary | ICD-10-CM

## 2022-09-27 DIAGNOSIS — Z79899 Other long term (current) drug therapy: Secondary | ICD-10-CM | POA: Diagnosis not present

## 2022-09-27 MED ORDER — LEVOCETIRIZINE DIHYDROCHLORIDE 5 MG PO TABS: 5.0000 mg | ORAL_TABLET | Freq: Every evening | ORAL | 3 refills | Status: AC

## 2022-09-27 MED ORDER — MONTELUKAST SODIUM 10 MG PO TABS: 10.0000 mg | ORAL_TABLET | Freq: Every day | ORAL | 3 refills | Status: AC

## 2022-09-27 MED ORDER — LORAZEPAM 0.5 MG PO TABS: ORAL_TABLET | ORAL | 0 refills | Status: DC

## 2022-09-27 MED ORDER — ESCITALOPRAM OXALATE 5 MG PO TABS: 5.0000 mg | ORAL_TABLET | Freq: Every day | ORAL | 1 refills | Status: DC

## 2022-09-27 NOTE — Telephone Encounter (Signed)
..   Encourage patient to contact the pharmacy for refills or they can request refills through [facility]:  09/27/22  NEXT APPOINTMENT DATE: 10/04/22   MEDICATION:zofran  Is the patient out of medication?  Yes   PHARMACY:[facility] on file   Let patient know to contact pharmacy at the end of the day to make sure medication is ready.  Please notify patient to allow 48-72 hours to process

## 2022-09-27 NOTE — Progress Notes (Signed)
Jacqueline Jimenez is a 46 y.o. female here for a follow up of a pre-existing problem.  History of Present Illness:   Chief Complaint  Patient presents with   Anxiety   Depression    HPI  Anxiety; Depression Patient states that she has not been compliant with 100 mg Zoloft for at least a month now. She had no side effects or symptoms of withdrawal from medication. She reports that she ran out, but wants to try a different medication because she feels that it is not as effective as it once was. Patient states that she has previously been on prozac when she was younger. She is inquiring about lexapro. Patient is also requesting a refill on 0.5 mg Ativan.   Sports medicine referral  She reports that she needs a new referral to sports medicine for sciatic pain due to her health insurance.   Allergies Patient is complaint with her 5 mg Xyzal and 10 mg Singulair medications and is requesting refills on both this visit.    Immunizations She is receiving an influenza vaccine this visit.  Past Medical History:  Diagnosis Date   Allergy    seasonal   Anxiety    Arthritis    Blood type, Rh negative    Depression    History of chicken pox    PID (pelvic inflammatory disease)    require hospitalizaiton at age 85   Pyloric stenosis    as an infant     Social History   Tobacco Use   Smoking status: Former    Packs/day: 0.25    Years: 11.00    Total pack years: 2.75    Types: Cigarettes    Quit date: 2006    Years since quitting: 17.9   Smokeless tobacco: Never  Vaping Use   Vaping Use: Never used  Substance Use Topics   Alcohol use: Not Currently    Comment: Sober times 2 days    Drug use: Not Currently    Types: Other-see comments    Comment: Opiates    Past Surgical History:  Procedure Laterality Date   APPENDECTOMY     calf augmentation     CESAREAN SECTION     club foot correction     DILATION AND CURETTAGE OF UTERUS     HERNIA REPAIR     RHINOPLASTY       Family History  Problem Relation Age of Onset   Asthma Mother    Breast cancer Mother 7       survivor   Hypertension Father    Diabetes Maternal Grandmother    Breast cancer Maternal Grandmother 47   Breast cancer Other        unknown age and pased away    No Known Allergies  Current Medications:   Current Outpatient Medications:    escitalopram (LEXAPRO) 5 MG tablet, Take 1 tablet (5 mg total) by mouth daily., Disp: 30 tablet, Rfl: 1   fluticasone (FLONASE) 50 MCG/ACT nasal spray, Place 1 spray into both nostrils daily., Disp: , Rfl:    ibuprofen (ADVIL,MOTRIN) 200 MG tablet, Take 400 mg by mouth every 6 (six) hours as needed for moderate pain., Disp: , Rfl:    methadone (DOLOPHINE) 10 MG/ML solution, Take 120 mg by mouth daily. [facility], Disp: , Rfl:    levocetirizine (XYZAL) 5 MG tablet, Take 1 tablet (5 mg total) by mouth every evening., Disp: 90 tablet, Rfl: 3   LORazepam (ATIVAN) 0.5 MG tablet,  Take 0.5 mg tablet as needed up to daily for anxiety, Disp: 20 tablet, Rfl: 0   montelukast (SINGULAIR) 10 MG tablet, Take 1 tablet (10 mg total) by mouth at bedtime., Disp: 90 tablet, Rfl: 3   Review of Systems:   ROS Negative unless otherwise specified per HPI.  Vitals:   Vitals:   09/27/22 1121  BP: 135/88  Pulse: 73  Temp: (!) 97.5 F (36.4 C)  TempSrc: Temporal  SpO2: 96%  Weight: 163 lb (73.9 kg)  Height: '5\' 4"'$  (1.626 m)     Body mass index is 27.98 kg/m.  Physical Exam:   Physical Exam Constitutional:      General: She is not in acute distress.    Appearance: Normal appearance. She is not ill-appearing.  HENT:     Head: Normocephalic and atraumatic.     Right Ear: External ear normal.     Left Ear: External ear normal.  Eyes:     Extraocular Movements: Extraocular movements intact.     Pupils: Pupils are equal, round, and reactive to light.  Cardiovascular:     Rate and Rhythm: Normal rate and regular rhythm.     Heart  sounds: Normal heart sounds. No murmur heard.    No gallop.  Pulmonary:     Effort: Pulmonary effort is normal. No respiratory distress.     Breath sounds: Normal breath sounds. No wheezing or rales.  Skin:    General: Skin is warm and dry.  Neurological:     Mental Status: She is alert and oriented to person, place, and time.  Psychiatric:        Judgment: Judgment normal.     Assessment and Plan:   Chronic midline low back pain with right-sided sciatica No red flags on discussion Currently without symptoms Referral to sports medicine per patient request  Non-seasonal allergic rhinitis due to pollen Well controlled Refill xyzal and singulair  Follow-up as needed  High risk medication use EKG tracing is personally reviewed.  EKG notes NSR.  No acute changes.  No Qtc prolongation noted  Anxious depression Reviewed Qtc risks with methadone + lexapro Baseline EKG today shows normal Qtc Start 5 mg lexapro and follow-up in 1 week to recheck EKG Low threshold to stop lexapro if any concerns  Need for immunization against influenza Completed today  I,[name],acting as a scribe for [employer], PA.,have documented all relevant documentation on the behalf of Jacqueline Coke, PA,as directed by  Jacqueline Coke, PA while in the presence of Jacqueline Jimenez, [place].  I, Jacqueline Jimenez, [place], have reviewed all documentation for this visit. The documentation on 09/27/22 for the exam, diagnosis, procedures, and orders are all accurate and complete.   Jacqueline Coke, PA-C

## 2022-09-27 NOTE — Patient Instructions (Addendum)
It was great to see you!  I will place referral for sports medicine.  Start lexapro 5 mg daily If you develop suicidal thoughts, please tell someone and immediately proceed to our local 24/7 crisis center, [facility]. [address], [place], [place] [address] [phone].  Let's follow-up in 1 week with me. We will repeat EKG.  Take care,  [name] PA-C

## 2022-09-30 MED ORDER — PROMETHAZINE HCL 12.5 MG PO TABS: 12.5000 mg | ORAL_TABLET | Freq: Three times a day (TID) | ORAL | 0 refills | Status: DC | PRN

## 2022-09-30 NOTE — Telephone Encounter (Signed)
Rx is not on pt current med list. When I go to reconcile it is on there with 2 different mg, one 4 and then another 8 but it was never filled by you. Please advise.

## 2022-09-30 NOTE — Telephone Encounter (Signed)
Lvm for pt to give me call back.

## 2022-10-04 ENCOUNTER — Ambulatory Visit (INDEPENDENT_AMBULATORY_CARE_PROVIDER_SITE_OTHER): Payer: No Typology Code available for payment source | Admitting: Physician Assistant

## 2022-10-04 ENCOUNTER — Encounter: Payer: Self-pay | Admitting: Physician Assistant

## 2022-10-04 VITALS — BP 120/80 | HR 66 | Temp 98.2°F | Ht 64.0 in | Wt 167.0 lb

## 2022-10-04 DIAGNOSIS — Z79899 Other long term (current) drug therapy: Secondary | ICD-10-CM

## 2022-10-04 DIAGNOSIS — F418 Other specified anxiety disorders: Secondary | ICD-10-CM | POA: Diagnosis not present

## 2022-10-04 DIAGNOSIS — K760 Fatty (change of) liver, not elsewhere classified: Secondary | ICD-10-CM | POA: Diagnosis not present

## 2022-10-04 DIAGNOSIS — E663 Overweight: Secondary | ICD-10-CM | POA: Diagnosis not present

## 2022-10-04 MED ORDER — ZEPBOUND 2.5 MG/0.5ML ~~LOC~~ SOAJ: 2.5000 mg | SUBCUTANEOUS | 0 refills | Status: DC

## 2022-10-04 NOTE — Progress Notes (Addendum)
Jacqueline Jimenez is a 46 y.o. female here for a follow up of a pre-existing problem.  History of Present Illness:   Chief Complaint  Patient presents with   Anxiety   Depression    Pt started Lexapro 5 mg daily on Sunday.    HPI  Anxiety and Depression Taking lexapro 5 mg daily x 5 days. Tolerating well Denies any side effects -- denies worsening anxiety/depression, chest pain    Overweight/Hepatic Steatosis She is interested in a GLP-1 RA. Brother is taking ozempic and doing well with weight loss and she would like to trial this, especially to see if this can help her fatty liver She is currently exercising regularly and trying to eat a healthy diet Denies fam hx of thyroid cancer   Past Medical History:  Diagnosis Date   Allergy    seasonal   Anxiety    Arthritis    Blood type, Rh negative    Depression    History of chicken pox    PID (pelvic inflammatory disease)    require hospitalizaiton at age 35   Pyloric stenosis    as an infant     Social History   Tobacco Use   Smoking status: Former    Packs/day: 0.25    Years: 11.00    Total pack years: 2.75    Types: Cigarettes    Quit date: 2006    Years since quitting: 17.9   Smokeless tobacco: Never  Vaping Use   Vaping Use: Never used  Substance Use Topics   Alcohol use: Not Currently    Comment: Sober times 2 days    Drug use: Not Currently    Types: Other-see comments    Comment: Opiates    Past Surgical History:  Procedure Laterality Date   APPENDECTOMY     calf augmentation     CESAREAN SECTION     club foot correction     DILATION AND CURETTAGE OF UTERUS     HERNIA REPAIR     RHINOPLASTY      Family History  Problem Relation Age of Onset   Asthma Mother    Breast cancer Mother 26       survivor   Hypertension Father    Diabetes Maternal Grandmother    Breast cancer Maternal Grandmother 80   Breast cancer Other        unknown age and pased away    No Known Allergies  Current  Medications:   Current Outpatient Medications:    escitalopram (LEXAPRO) 5 MG tablet, Take 1 tablet (5 mg total) by mouth daily., Disp: 30 tablet, Rfl: 1   fluticasone (FLONASE) 50 MCG/ACT nasal spray, Place 1 spray into both nostrils daily., Disp: , Rfl:    ibuprofen (ADVIL,MOTRIN) 200 MG tablet, Take 400 mg by mouth every 6 (six) hours as needed for moderate pain., Disp: , Rfl:    levocetirizine (XYZAL) 5 MG tablet, Take 1 tablet (5 mg total) by mouth every evening., Disp: 90 tablet, Rfl: 3   LORazepam (ATIVAN) 0.5 MG tablet, Take 0.5 mg tablet as needed up to daily for anxiety, Disp: 20 tablet, Rfl: 0   methadone (DOLOPHINE) 10 MG/ML solution, Take 120 mg by mouth daily. [facility], Disp: , Rfl:    montelukast (SINGULAIR) 10 MG tablet, Take 1 tablet (10 mg total) by mouth at bedtime., Disp: 90 tablet, Rfl: 3   promethazine (PHENERGAN) 12.5 MG tablet, Take 1 tablet (12.5 mg total) by mouth every  8 (eight) hours as needed for nausea or vomiting., Disp: 20 tablet, Rfl: 0   Tirzepatide-Weight Management (ZEPBOUND) 2.5 MG/0.5ML SOAJ, Inject 2.5 mg into the skin once a week., Disp: 2 mL, Rfl: 0   Review of Systems:   ROS Negative unless otherwise specified per HPI.  Vitals:   Vitals:   10/04/22 1016  BP: 120/80  Pulse: 66  Temp: 98.2 F (36.8 C)  TempSrc: Temporal  Weight: 167 lb (75.8 kg)  Height: '5\' 4"'$  (1.626 m)     Body mass index is 28.67 kg/m.  Physical Exam:   Physical Exam Constitutional:      General: She is not in acute distress.    Appearance: Normal appearance. She is not ill-appearing.  HENT:     Head: Normocephalic and atraumatic.     Right Ear: External ear normal.     Left Ear: External ear normal.  Eyes:     Extraocular Movements: Extraocular movements intact.     Pupils: Pupils are equal, round, and reactive to light.  Cardiovascular:     Rate and Rhythm: Normal rate and regular rhythm.     Heart sounds: Normal heart sounds. No murmur  heard.    No gallop.  Pulmonary:     Effort: Pulmonary effort is normal. No respiratory distress.     Breath sounds: Normal breath sounds. No wheezing or rales.  Skin:    General: Skin is warm and dry.  Neurological:     Mental Status: She is alert and oriented to person, place, and time.  Psychiatric:        Judgment: Judgment normal.     Assessment and Plan:   High risk medication use; Anxious Depression Repeat EKG shows QT of 422, slight increase from prior EKG with QT of 408 Tolerating medication well, no red flags Continue medication and follow-up in 1 month to assess mood -- sooner if concerns Repeat EKG for any dose changes Denies SI/HI I discussed with patient that if they develop any SI, to tell someone immediately and seek medical attention.   Hepatic steatosis; Overweight No contraindications Discussed Zepbound -- side effects, risks/benefits discussed Will send in for patient Follow-up via MyChart to follow-up on this and if would like to increase/maintain dosage  I,[name],acting as a scribe for [employer], PA.,have documented all relevant documentation on the behalf of [name], PA,as directed by  [name], PA while in the presence of [name], [place].  I, [name], [place], have reviewed all documentation for this visit. The documentation on 10/04/22 for the exam, diagnosis, procedures, and orders are all accurate and complete.   [name], PA-C

## 2022-10-04 NOTE — Patient Instructions (Signed)
It was great to see you!  Continue lexapro 5 mg daily for now Follow-up in 1 month to assess how this is doing for your mood  I will send in Zepbound for you -- I cannot guarantee coverage or availability but we will send it in If you get this medication, start weekly and reach out to me within a few weeks of starting so we can determine if you want to stay on it or increase dosage  Take care,  [name] PA-C

## 2022-10-23 IMAGING — DX DG LUMBAR SPINE COMPLETE 4+V
5 series · 5 of 5 positions shown · non-contrast
Comparison: CT abdomen and pelvis 02/21/2017

CLINICAL DATA: Lower back pain and right hip pain for 1 week.

EXAM:
LUMBAR SPINE - COMPLETE 4+ VIEW

[l-spine ap]
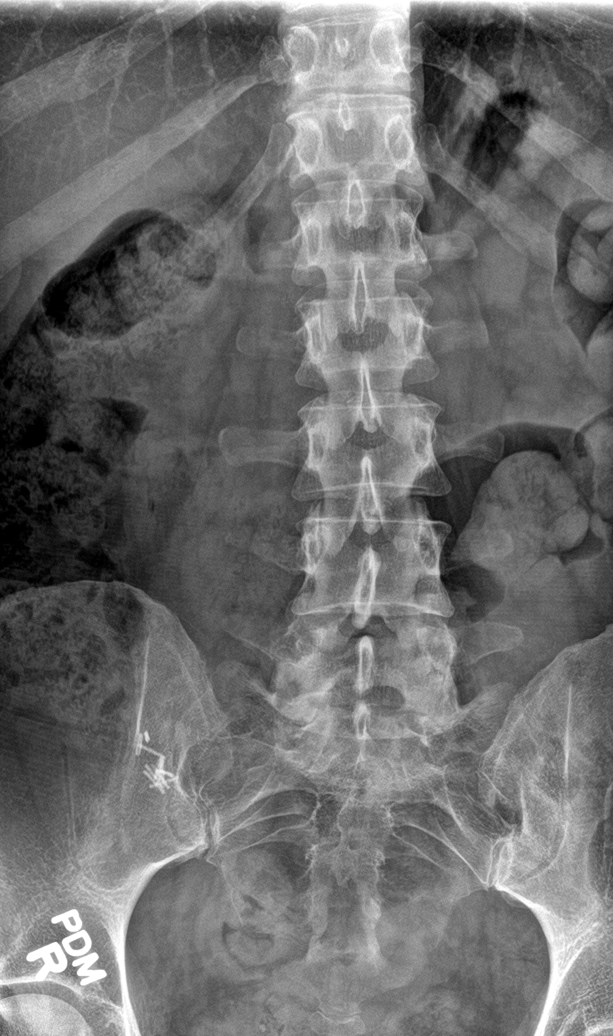

[l-spine obl (1 of 2)]
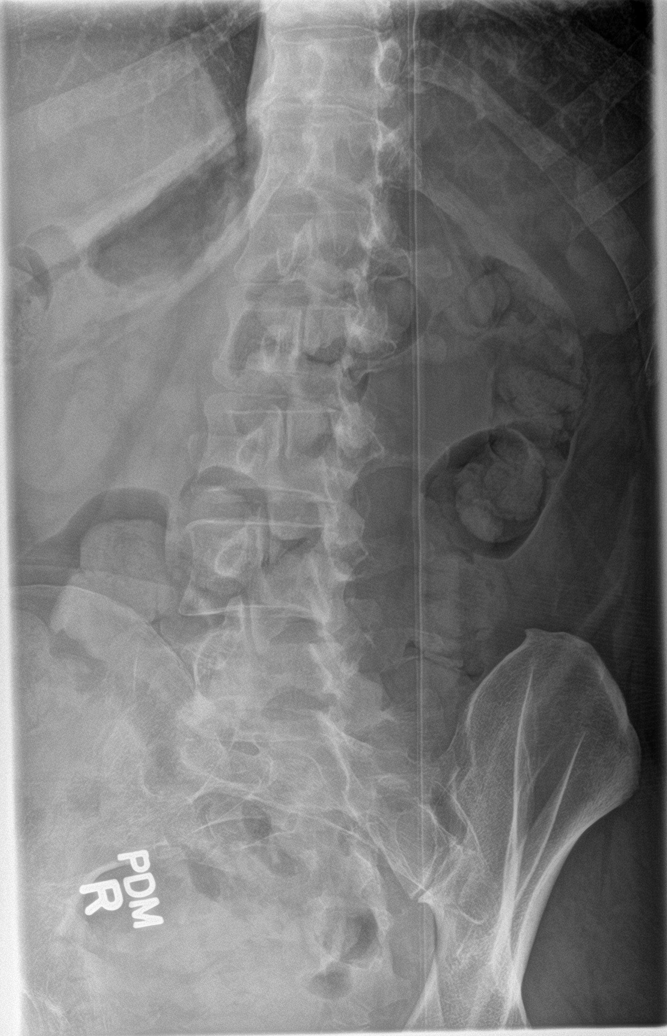

[l-spine obl (2 of 2)]
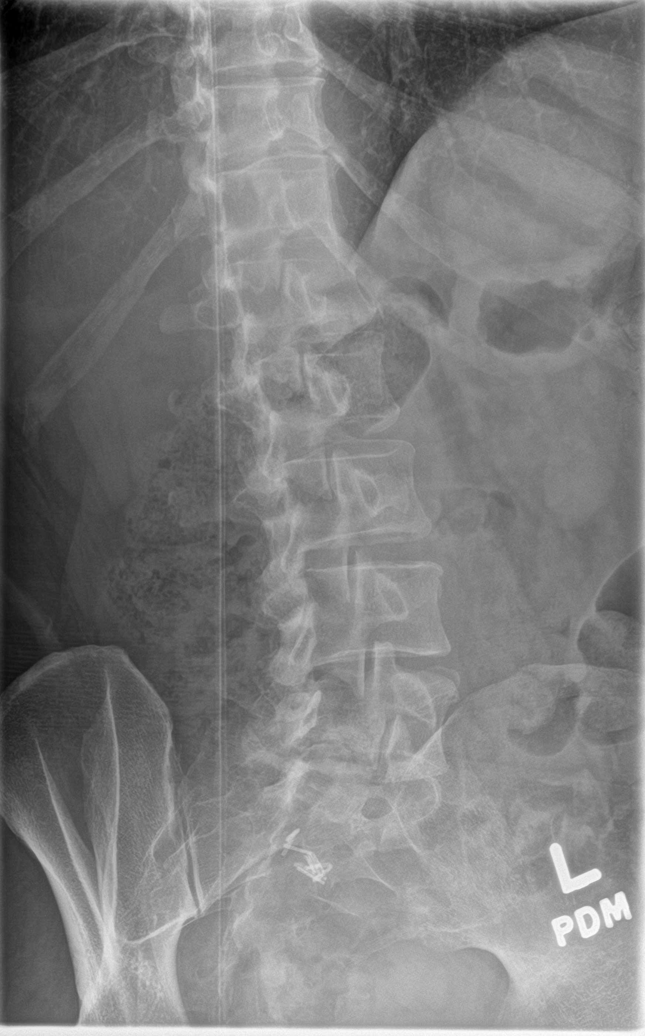

[l-spine lat]
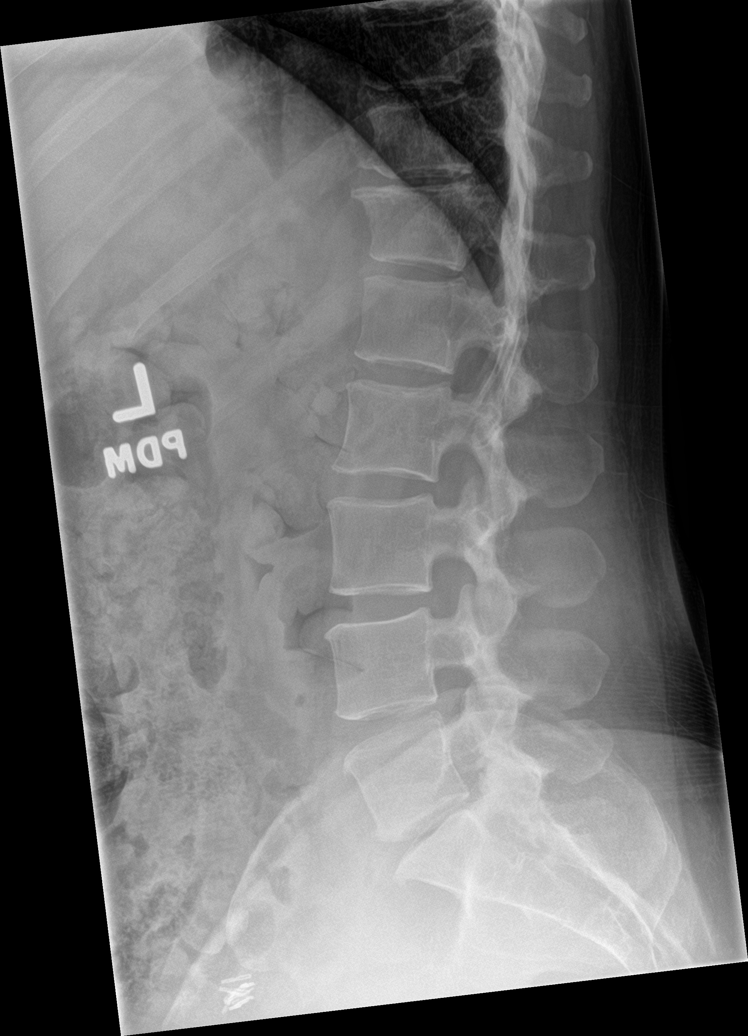

[l-spine spot]
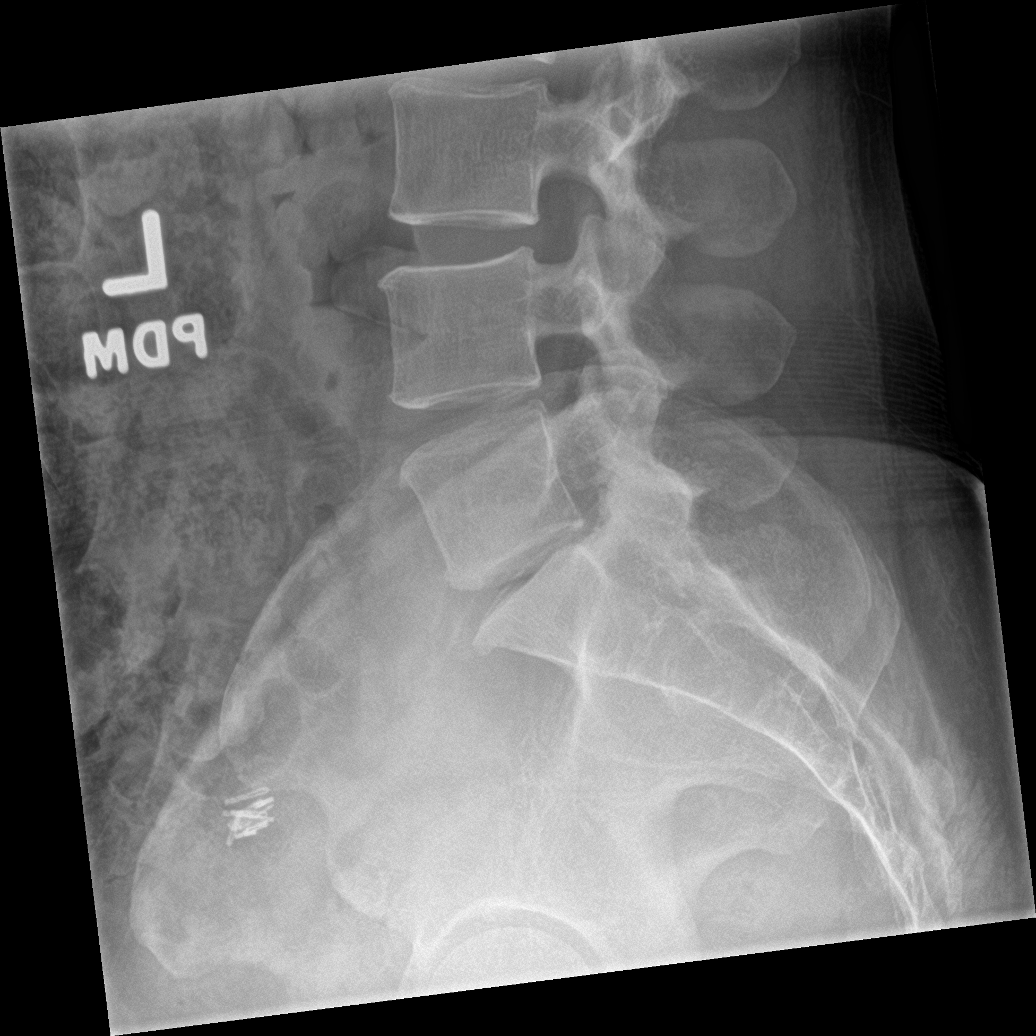

[5 of 5 positions shown; findings below may reference images not displayed]

FINDINGS: Unchanged mild retrolisthesis of L5 on S1. Moderate right and mild
left L5-S1 disc space narrowing as seen on prior CT. Mild L5-S1
degenerative vacuum disc. Minimal anterior superior T12 vertebral
body height loss with associated moderate anterior endplate spurring
and mild-to-moderate T11-12 disc space narrowing, similar to prior
CT.
IMPRESSION: 1. Mild retrolisthesis of L5 on S1 with moderate right and mild left
disc space narrowing, unchanged from prior.
2. Mild anterior T11-12 degenerative disc and endplate changes.

## 2022-10-24 ENCOUNTER — Encounter: Payer: Self-pay | Admitting: Physician Assistant

## 2022-10-25 ENCOUNTER — Telehealth (INDEPENDENT_AMBULATORY_CARE_PROVIDER_SITE_OTHER): Payer: No Typology Code available for payment source | Admitting: Family Medicine

## 2022-10-25 DIAGNOSIS — U071 COVID-19: Secondary | ICD-10-CM | POA: Diagnosis not present

## 2022-10-25 MED ORDER — PREDNISONE 10 MG (21) PO TBPK: ORAL_TABLET | ORAL | 0 refills | Status: DC

## 2022-10-25 NOTE — Progress Notes (Signed)
Virtual Visit via Video Note  I connected with Jacqueline Jimenez on 10/25/2022 at  1:40 PM EST by a video enabled telemedicine application and verified that I am speaking with the correct person using two identifiers.  Location: Patient: Home Provider: Office  I discussed the limitations of evaluation and management by telemedicine and the availability of in person appointments. The patient expressed understanding and agreed to proceed.  History of Present Illness:  Chief Complaint  Patient presents with   Covid Positive    Covid Positive x 1 day.  Cough, ear pain, sore throat, runny nose , fatigue x 3 days.   Patient is a 47 year old female.  Problem 1: The patient reported testing positive for COVID-19 a day prior to the consultation and has been experiencing symptoms related to the virus for the past three days. The patient mentioned a cough, a feeling of general malaise with tiredness, sore throat, and runny nose. There is current use of over-the-counter medications to alleviate symptoms with minimal relief reported. The patient does not report chest pain or severe shortness of breath at this time. Nasal congestion and ear pressure appear to be the most bothersome symptoms for the patient, despite the use of Flonase, Xyzal, Montelukast, and a vapor inhaler. The patient inquires about possible additional treatments to alleviate these symptoms, particularly the ear pressure and congestion.  Review of Systems:  General: No fever, chills, or significant weight changes reported. HEENT: Sore throat, laryngitis noted, no visual changes, ear pressure and congestion described. Respiratory: Cough noted, no severe shortness of breath, no chest pain. Gastrointestinal: No nausea, vomiting, or diarrhea. Musculoskeletal: No new muscle aches beyond general malaise. Neurological: No dizziness or headaches. All other systems reviewed and negative as per the patient's report.    Observations/Objective: There were no vitals filed for this visit.   Gen: Appears comfortable at rest. HEENT: No visible mucus or discharge noted via video assessment. Pulm: No work of breathing apparent; voice sounds hoarse. Skin: No visible rash or lesions noted. Neuro: No gross motor deficits appreciated; alert and oriented. Psych: Mood and affect appropriate to the situation.  Assessment and Plan: Problem List Items Addressed This Visit   None Visit Diagnoses     COVID    -  Primary   Relevant Medications   predniSONE (STERAPRED UNI-PAK 21 TAB) 10 MG (21) TBPK tablet      The patient presents with symptoms consistent with a recent diagnosis of COVID-19. The primary concern today is the management of nasal congestion and ear pressure.  Differential diagnosis: The most likely diagnosis is confirmed COVID-19. Other respiratory infections such as influenza or bacterial superinfection are less likely given the positive COVID-19 test.  Plan:  For the nasal congestion and ear pressure, considering the patient's discomfort and reported ineffectiveness of current treatments, a short course of oral steroids, such as prednisone, can be considered to help reduce inflammation and alleviate symptoms. The goal will be to provide symptomatic relief of the nasal congestion and ear pressure that is refractory to other treatments. Steroids may also provide some improvement in associated cough and runny nose.  There are no discontinued medications.   Follow Up Instructions:     I discussed the assessment and treatment plan with the patient. The patient was provided an opportunity to ask questions and all were answered. The patient agreed with the plan and demonstrated an understanding of the instructions.   The patient was advised to call back or seek an in-person evaluation if  the symptoms worsen or if the condition fails to improve as anticipated.  I provided 20 minutes of  non-face-to-face time during this encounter.   Garnette Gunner, MD

## 2022-10-27 IMAGING — US US ABDOMEN COMPLETE
1 series · 14 of 25 positions shown · non-contrast
Comparison: None Available.

CLINICAL DATA: Right upper quadrant pain

EXAM:
ABDOMEN ULTRASOUND COMPLETE

[Series 1: us abdomen complete · 14 of 80 slices shown]
[im 1/80]
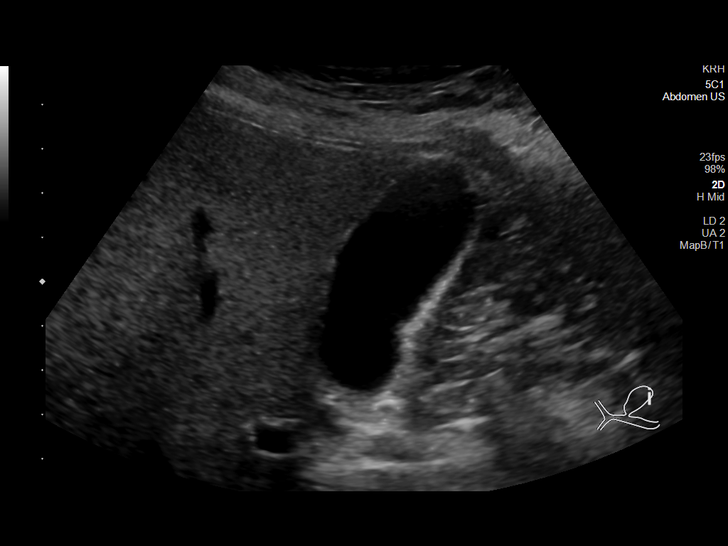
[im 7/80]
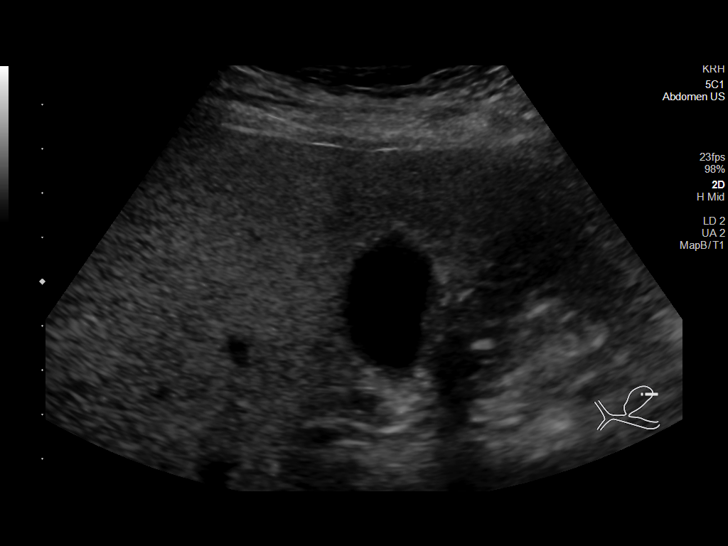
[im 14/80]
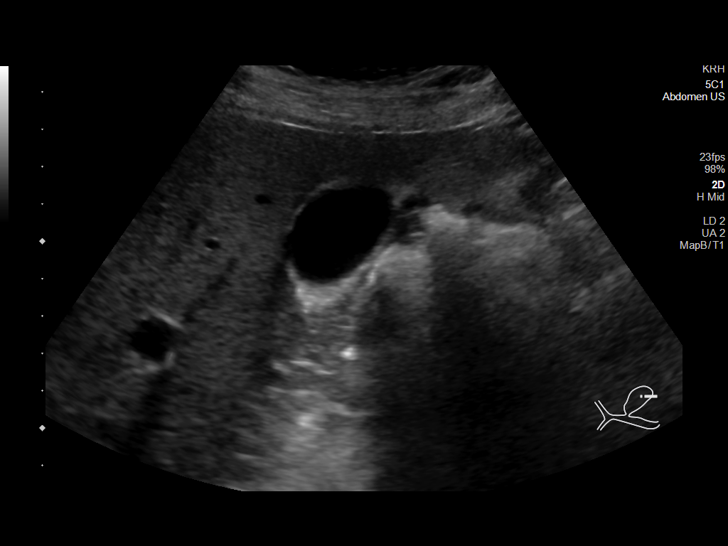
[im 20/80]
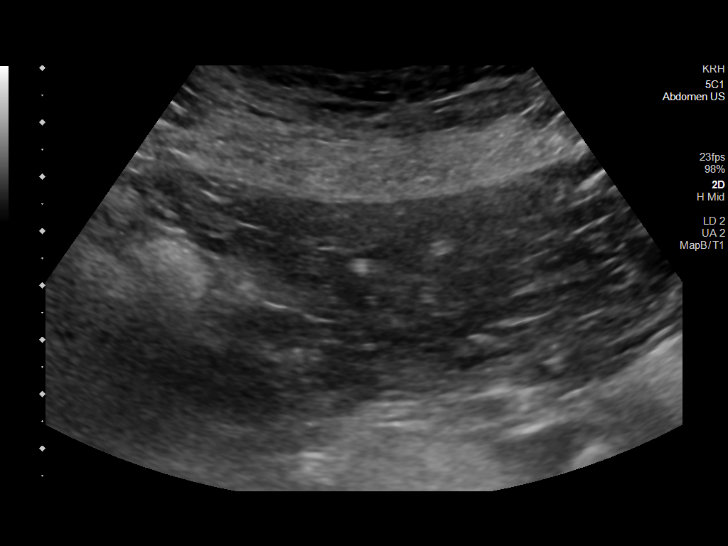
[im 27/80]
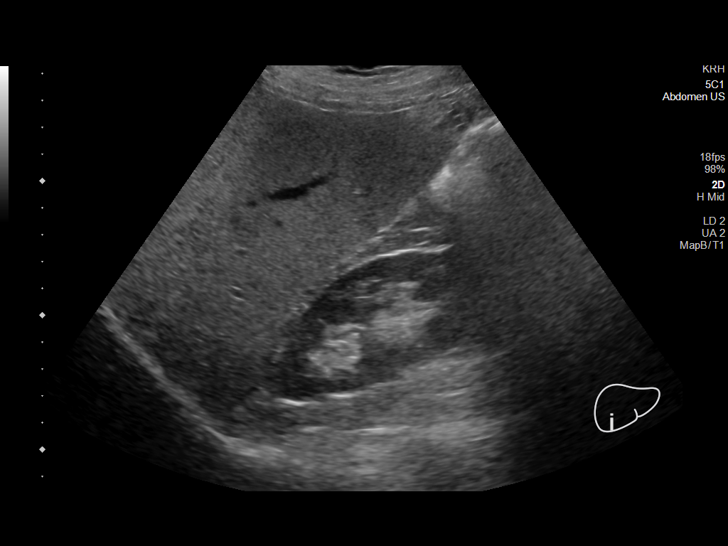
[im 30/80]
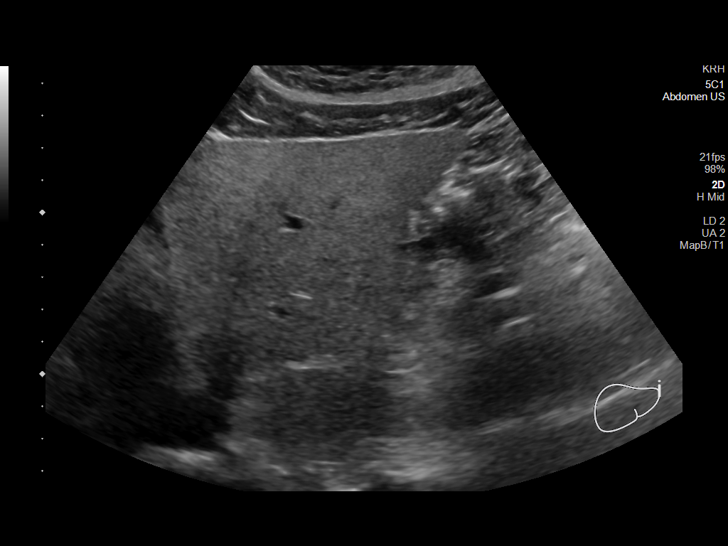
[im 37/80]
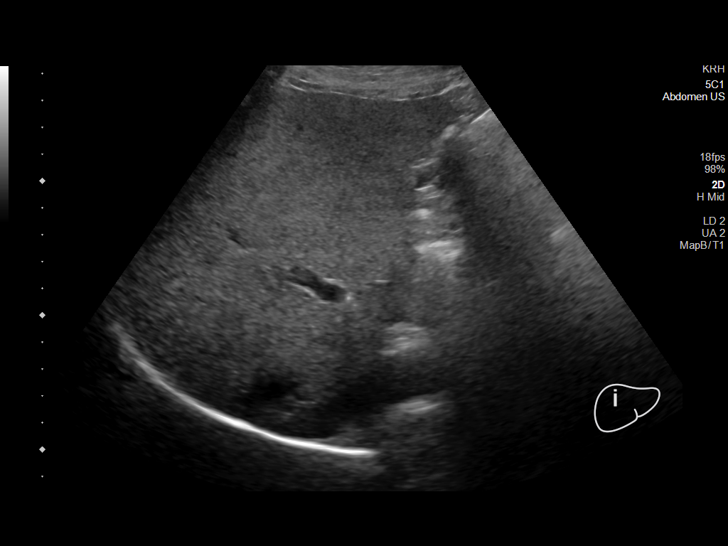
[im 43/80]
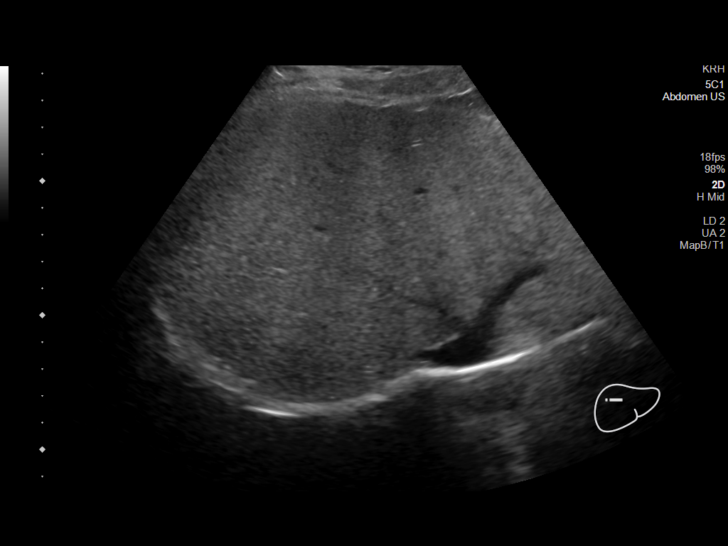
[im 50/80]
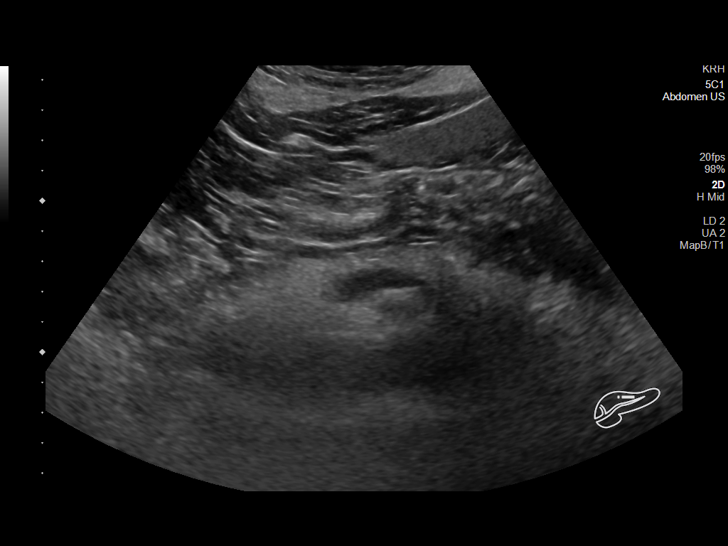
[im 53/80]
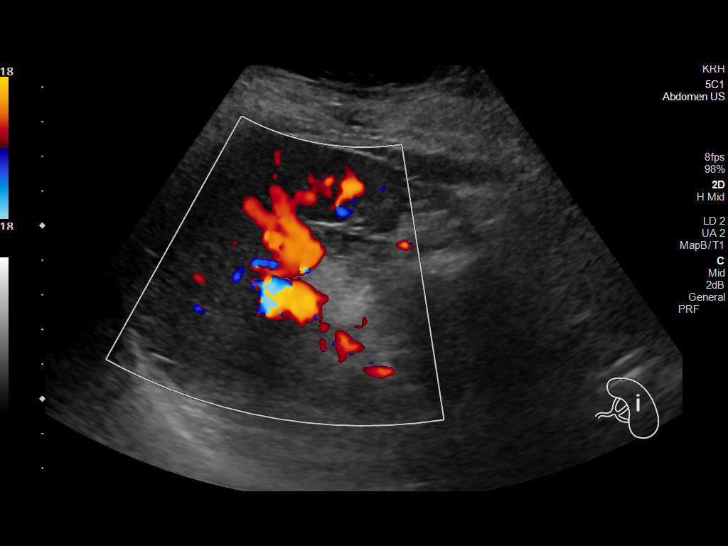
[im 60/80]
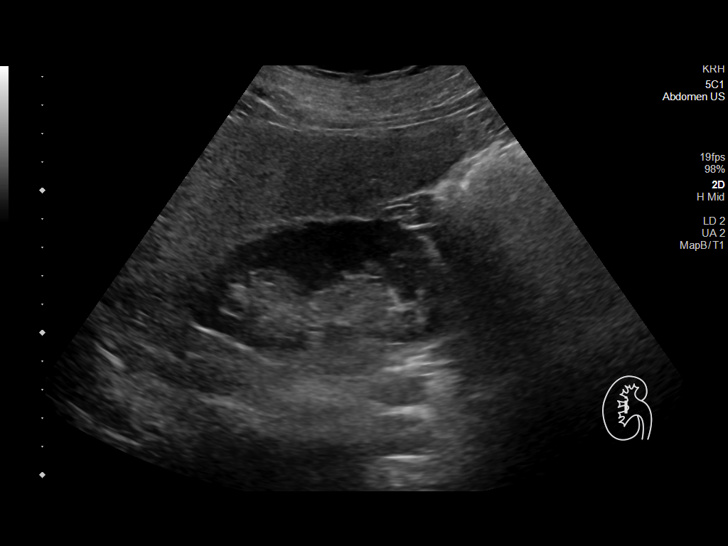
[im 66/80]
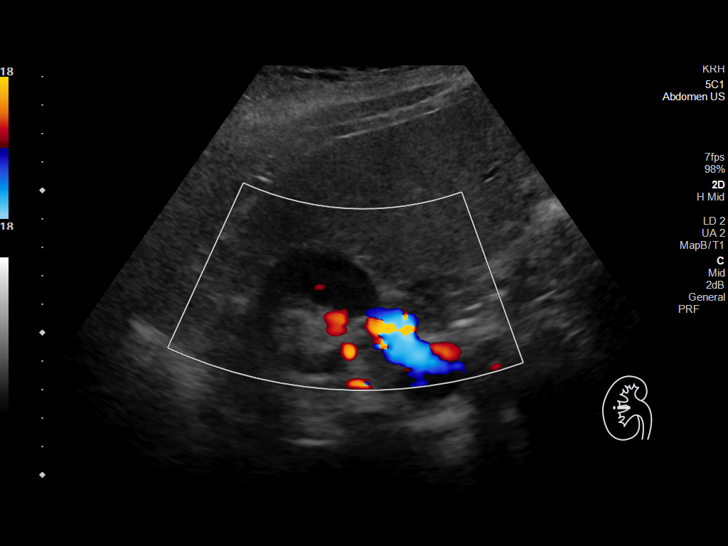
[im 73/80]
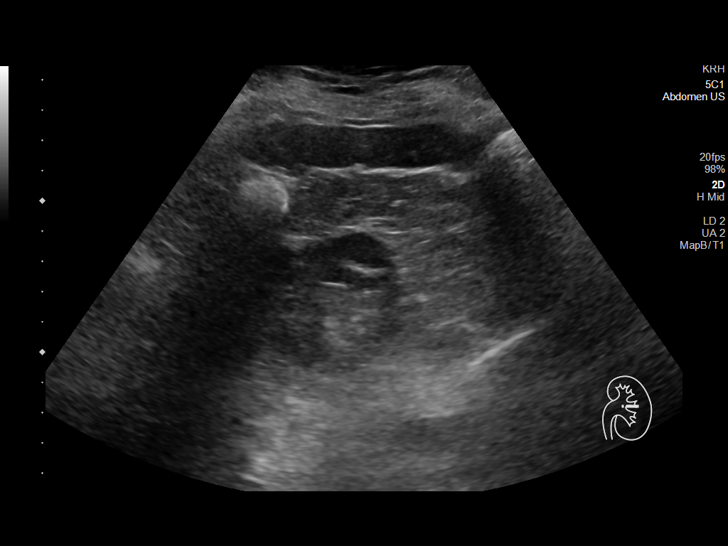
[im 80/80]
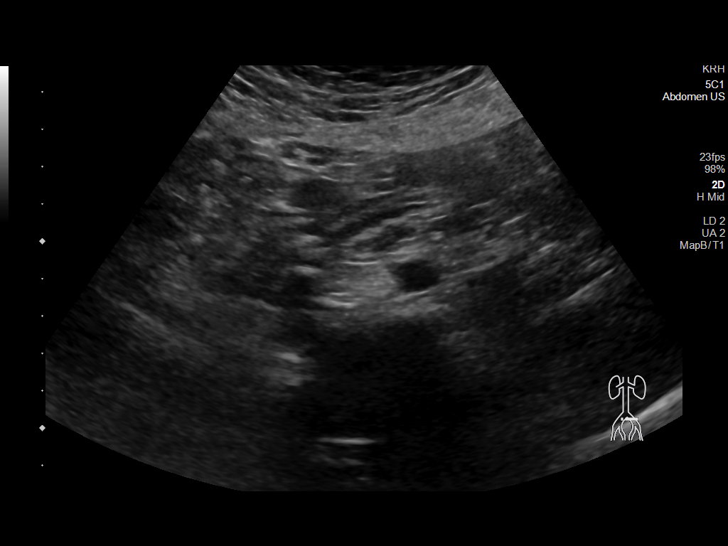

[14 of 25 positions shown; findings below may reference images not displayed]

FINDINGS: Gallbladder: No gallstones or wall thickening visualized. No
sonographic Murphy sign noted by sonographer.

Common bile duct: Diameter: 4 mm

Liver: Increased echogenicity of the parenchyma with no focal mass
identified. Portal vein is patent on color Doppler imaging with
normal direction of blood flow towards the liver.

IVC: No abnormality visualized.

Pancreas: Visualized portion unremarkable.

Spleen: Size and appearance within normal limits.

Right Kidney: Length: 10.2 cm. Echogenicity within normal limits. No
mass or hydronephrosis visualized.

Left Kidney: Length: 9.9 cm. Echogenicity within normal limits. No
mass or hydronephrosis visualized.

Abdominal aorta: No aneurysm visualized.

Other findings: No obvious abnormality visualized in the region of
surgical scar in the right upper quadrant.
IMPRESSION: 1. No acute process identified.
2. Increased echogenicity of the liver parenchyma suggesting hepatic
steatosis and/or other hepatocellular disease.

## 2022-11-01 ENCOUNTER — Ambulatory Visit: Payer: No Typology Code available for payment source | Admitting: Physician Assistant

## 2022-11-01 ENCOUNTER — Encounter: Payer: Self-pay | Admitting: Physician Assistant

## 2022-11-01 VITALS — BP 118/80 | HR 81 | Temp 97.8°F | Ht 64.0 in | Wt 166.0 lb

## 2022-11-01 DIAGNOSIS — F418 Other specified anxiety disorders: Secondary | ICD-10-CM | POA: Diagnosis not present

## 2022-11-01 DIAGNOSIS — U071 COVID-19: Secondary | ICD-10-CM | POA: Diagnosis not present

## 2022-11-01 LAB — POC COVID19 BINAXNOW: SARS Coronavirus 2 Ag: NEGATIVE

## 2022-11-01 NOTE — Patient Instructions (Signed)
It was great to see you!  COVID test is negative  Please follow-up if any lingering concerns.  Stop Afrin.  Let's follow-up in 3-67month to touch base on Zepbound progress and Lexapro, sooner if you have concerns.  Take care,  SInda CokePA-C

## 2022-11-01 NOTE — Progress Notes (Signed)
Jacqueline Jimenez is a 47 y.o. female here for a follow up of a pre-existing problem.  History of Present Illness:   Chief Complaint  Patient presents with   f/u COVID    Pt was Dx with COVID on 10/24/2022, had virtual visit on 1/5 was not given medication due to not being high risk. Pt still c/o body aches, cough and post nasal drip, ear pressure, fatigue. Denies fever or chills.    Anxiety   Depression    Pt is currently taking Lexapro 5 mg daily, no adverse reactions.    HPI   COVID Pt had positive test on 10/24/22. She developed headaches, nasal congestion, and fevers after a trip to [place].  She was seen virtually on 10/25/22. She has not returned to work as she is still feeling poorly. She continues to have body aches, cough, postnasal drip, ear pressure, and fatigue. She has been having coughing fits which cause her to gag briefly. Denies any recent fevers, chest pain, or shortness of breath. She has been using Afrin nasal spray but is aware that she does not need to use this for more than 3 days. She had COVID in the past. She has had COVID vaccines but has not had any boosters in the last 2 days.  Anxiety, Depression Patient's Zoloft '100mg'$  was discontinued and she was started on Lexapro '5mg'$  at last visit 09/27/22. She has been compliant with Lexapro without any adverse side effects. She denies any SI/HI.  Weight Management She has not yet started the Zepbound as she would prefer to wait until she has recovered from [place] and resumes her exercise regimen. Wt Readings from Last 5 Encounters:  11/01/22 166 lb (75.3 kg)  10/04/22 167 lb (75.8 kg)  09/27/22 163 lb (73.9 kg)  06/06/22 163 lb (73.9 kg)  05/09/22 161 lb (73 kg)     Past Medical History:  Diagnosis Date   Allergy    seasonal   Anxiety    Arthritis    Blood type, Rh negative    Depression    History of chicken pox    PID (pelvic inflammatory disease)    require hospitalizaiton at age 61   Pyloric  stenosis    as an infant     Social History   Tobacco Use   Smoking status: Former    Packs/day: 0.25    Years: 11.00    Total pack years: 2.75    Types: Cigarettes    Quit date: 2006    Years since quitting: 18.0   Smokeless tobacco: Never  Vaping Use   Vaping Use: Never used  Substance Use Topics   Alcohol use: Not Currently    Comment: Sober times 2 days    Drug use: Not Currently    Types: Other-see comments    Comment: Opiates    Past Surgical History:  Procedure Laterality Date   APPENDECTOMY     calf augmentation     CESAREAN SECTION     club foot correction     DILATION AND CURETTAGE OF UTERUS     HERNIA REPAIR     RHINOPLASTY      Family History  Problem Relation Age of Onset   Asthma Mother    Breast cancer Mother 64       survivor   Hypertension Father    Diabetes Maternal Grandmother    Breast cancer Maternal Grandmother 76   Breast cancer Other        unknown  age and pased away    No Known Allergies  Current Medications:   Current Outpatient Medications:    escitalopram (LEXAPRO) 5 MG tablet, Take 1 tablet (5 mg total) by mouth daily., Disp: 30 tablet, Rfl: 1   fluticasone (FLONASE) 50 MCG/ACT nasal spray, Place 1 spray into both nostrils daily., Disp: , Rfl:    ibuprofen (ADVIL,MOTRIN) 200 MG tablet, Take 400 mg by mouth every 6 (six) hours as needed for moderate pain., Disp: , Rfl:    levocetirizine (XYZAL) 5 MG tablet, Take 1 tablet (5 mg total) by mouth every evening., Disp: 90 tablet, Rfl: 3   LORazepam (ATIVAN) 0.5 MG tablet, Take 0.5 mg tablet as needed up to daily for anxiety, Disp: 20 tablet, Rfl: 0   methadone (DOLOPHINE) 10 MG/ML solution, Take 120 mg by mouth daily. [facility], Disp: , Rfl:    montelukast (SINGULAIR) 10 MG tablet, Take 1 tablet (10 mg total) by mouth at bedtime., Disp: 90 tablet, Rfl: 3   predniSONE (STERAPRED UNI-PAK 21 TAB) 10 MG (21) TBPK tablet, Take as directed, Disp: 21 tablet, Rfl: 0    promethazine (PHENERGAN) 12.5 MG tablet, Take 1 tablet (12.5 mg total) by mouth every 8 (eight) hours as needed for nausea or vomiting., Disp: 20 tablet, Rfl: 0   Tirzepatide-Weight Management (ZEPBOUND) 2.5 MG/0.5ML SOAJ, Inject 2.5 mg into the skin once a week. (Patient not taking: Reported on 10/25/2022), Disp: 2 mL, Rfl: 0   Review of Systems:   Review of Systems  Constitutional:  Positive for fever and malaise/fatigue.  HENT:  Positive for congestion. Negative for sinus pain and sore throat.        + postnasal drip + ear pressure  Respiratory:  Positive for cough. Negative for shortness of breath and wheezing.   Cardiovascular:  Negative for chest pain and palpitations.  Gastrointestinal:  Negative for blood in stool, constipation, diarrhea, nausea and vomiting.  Genitourinary:  Negative for dysuria, frequency and hematuria.  Musculoskeletal:  Positive for myalgias.  Neurological:  Positive for headaches.    Vitals:   Vitals:   11/01/22 1111  BP: 118/80  Pulse: 81  Temp: 97.8 F (36.6 C)  TempSrc: Temporal  SpO2: 95%  Weight: 166 lb (75.3 kg)  Height: '5\' 4"'$  (1.626 m)     Body mass index is 28.49 kg/m.  Physical Exam:   Physical Exam Vitals and nursing note reviewed.  Constitutional:      General: She is not in acute distress.    Appearance: She is well-developed. She is not ill-appearing or toxic-appearing.  Cardiovascular:     Rate and Rhythm: Normal rate and regular rhythm.     Pulses: Normal pulses.     Heart sounds: Normal heart sounds, S1 normal and S2 normal.  Pulmonary:     Effort: Pulmonary effort is normal.     Breath sounds: Normal breath sounds.  Skin:    General: Skin is warm and dry.  Neurological:     Mental Status: She is alert.     GCS: GCS eye subscore is 4. GCS verbal subscore is 5. GCS motor subscore is 6.  Psychiatric:        Speech: Speech normal.        Behavior: Behavior normal. Behavior is cooperative.    Results for orders placed  or performed in visit on 11/01/22  POC COVID-19  Result Value Ref Range   SARS Coronavirus 2 Ag Negative Negative    Assessment and Plan:  Anxious depression Improving Continue lexapro 5 mg daily Follow-up in 3-6 mo, sooner if concerns  COVID-19 No red flags Slowly improving Push fluids and rest Work note Follow-up if any new/red flag sx  I,Alexis Herring,acting as a Education administrator for [employer], PA.,have documented all relevant documentation on the behalf of [name], PA,as directed by  [name], PA while in the presence of [name], [place].  I, [name], [place], have reviewed all documentation for this visit. The documentation on 11/01/22 for the exam, diagnosis, procedures, and orders are all accurate and complete.  [name], PA-C

## 2022-11-10 ENCOUNTER — Encounter: Payer: Self-pay | Admitting: Physician Assistant

## 2022-11-11 ENCOUNTER — Encounter: Payer: Self-pay | Admitting: Physician Assistant

## 2022-11-13 ENCOUNTER — Ambulatory Visit (INDEPENDENT_AMBULATORY_CARE_PROVIDER_SITE_OTHER): Payer: No Typology Code available for payment source | Admitting: Physician Assistant

## 2022-11-13 ENCOUNTER — Encounter: Payer: Self-pay | Admitting: Physician Assistant

## 2022-11-13 ENCOUNTER — Other Ambulatory Visit: Payer: Self-pay | Admitting: Physician Assistant

## 2022-11-13 VITALS — BP 120/80 | HR 73 | Temp 97.3°F | Ht 64.0 in | Wt 167.2 lb

## 2022-11-13 DIAGNOSIS — R051 Acute cough: Secondary | ICD-10-CM

## 2022-11-13 MED ORDER — AMOXICILLIN-POT CLAVULANATE 400-57 MG/5ML PO SUSR: 800.0000 mg | Freq: Two times a day (BID) | ORAL | 0 refills | Status: AC

## 2022-11-13 NOTE — Patient Instructions (Signed)
It was great to see you!  Start augmentin antibiotic  Start over the counter plain mucinex/Guaifenesin  Push fluids and rest  Take care,  [name] PA-C

## 2022-11-13 NOTE — Progress Notes (Signed)
Jacqueline Jimenez is a 47 y.o. female here for a new problem.  History of Present Illness:   Chief Complaint  Patient presents with   Cough    Pt c/o cough since COVID, expectorating light yellow sputum, post nasal drip, ear pressure.    Cough She has a cough with light yellow phlegm. She also reports having postnasal drip and pressure in her ears. She reports taking Sudafed to treat at home. She reports hydrating adequately, normal appetite. She reports continuing with Singular, Flonase and Xyzal. Sx started 3 weeks ago and worsening with time.  Denies: chest pain, SOB, v/d  Past Medical History:  Diagnosis Date   Allergy    seasonal   Anxiety    Arthritis    Blood type, Rh negative    Depression    History of chicken pox    PID (pelvic inflammatory disease)    require hospitalizaiton at age 66   Pyloric stenosis    as an infant     Social History   Tobacco Use   Smoking status: Former    Packs/day: 0.25    Years: 11.00    Total pack years: 2.75    Types: Cigarettes    Quit date: 2006    Years since quitting: 18.0   Smokeless tobacco: Never  Vaping Use   Vaping Use: Never used  Substance Use Topics   Alcohol use: Not Currently    Comment: Sober times 2 days    Drug use: Not Currently    Types: Other-see comments    Comment: Opiates    Past Surgical History:  Procedure Laterality Date   APPENDECTOMY     calf augmentation     CESAREAN SECTION     club foot correction     DILATION AND CURETTAGE OF UTERUS     HERNIA REPAIR     RHINOPLASTY      Family History  Problem Relation Age of Onset   Asthma Mother    Breast cancer Mother 36       survivor   Hypertension Father    Diabetes Maternal Grandmother    Breast cancer Maternal Grandmother 40   Breast cancer Other        unknown age and pased away    No Known Allergies  Current Medications:   Current Outpatient Medications:    escitalopram (LEXAPRO) 5 MG tablet, Take 1 tablet (5 mg total) by  mouth daily., Disp: 30 tablet, Rfl: 1   fluticasone (FLONASE) 50 MCG/ACT nasal spray, Place 1 spray into both nostrils daily., Disp: , Rfl:    ibuprofen (ADVIL,MOTRIN) 200 MG tablet, Take 400 mg by mouth every 6 (six) hours as needed for moderate pain., Disp: , Rfl:    levocetirizine (XYZAL) 5 MG tablet, Take 1 tablet (5 mg total) by mouth every evening., Disp: 90 tablet, Rfl: 3   LORazepam (ATIVAN) 0.5 MG tablet, Take 0.5 mg tablet as needed up to daily for anxiety, Disp: 20 tablet, Rfl: 0   methadone (DOLOPHINE) 10 MG/ML solution, Take 120 mg by mouth daily. [facility], Disp: , Rfl:    montelukast (SINGULAIR) 10 MG tablet, Take 1 tablet (10 mg total) by mouth at bedtime., Disp: 90 tablet, Rfl: 3   promethazine (PHENERGAN) 12.5 MG tablet, Take 1 tablet (12.5 mg total) by mouth every 8 (eight) hours as needed for nausea or vomiting., Disp: 20 tablet, Rfl: 0   Tirzepatide-Weight Management (ZEPBOUND) 2.5 MG/0.5ML SOAJ, Inject 2.5 mg into the  skin once a week. (Patient not taking: Reported on 10/25/2022), Disp: 2 mL, Rfl: 0   Review of Systems:   Review of Systems  HENT:  Positive for ear pain (Pressure in ears).        (+) Postnasal drip  Respiratory:  Positive for cough.     Vitals:   Vitals:   11/13/22 1335  BP: 120/80  Pulse: 73  Temp: (!) 97.3 F (36.3 C)  TempSrc: Temporal  SpO2: 97%  Weight: 167 lb 4 oz (75.9 kg)  Height: '5\' 4"'$  (1.626 m)     Body mass index is 28.71 kg/m.  Physical Exam:   Physical Exam Vitals and nursing note reviewed.  Constitutional:      General: She is not in acute distress.    Appearance: She is well-developed. She is not ill-appearing or toxic-appearing.  HENT:     Head: Normocephalic and atraumatic.     Right Ear: Ear canal and external ear normal. A middle ear effusion is present. Tympanic membrane is not erythematous, retracted or bulging.     Left Ear: Ear canal and external ear normal. A middle ear effusion is present.  Tympanic membrane is not erythematous, retracted or bulging.     Nose:     Right Sinus: Frontal sinus tenderness present. No maxillary sinus tenderness.     Left Sinus: Frontal sinus tenderness present. No maxillary sinus tenderness.     Mouth/Throat:     Pharynx: Uvula midline. No posterior oropharyngeal erythema.  Eyes:     General: Lids are normal.     Conjunctiva/sclera: Conjunctivae normal.  Neck:     Trachea: Trachea normal.  Cardiovascular:     Rate and Rhythm: Normal rate and regular rhythm.     Pulses: Normal pulses.     Heart sounds: Normal heart sounds, S1 normal and S2 normal.  Pulmonary:     Effort: Pulmonary effort is normal.     Breath sounds: Normal breath sounds. No decreased breath sounds, wheezing, rhonchi or rales.  Lymphadenopathy:     Cervical: No cervical adenopathy.  Skin:    General: Skin is warm and dry.  Neurological:     Mental Status: She is alert.     GCS: GCS eye subscore is 4. GCS verbal subscore is 5. GCS motor subscore is 6.  Psychiatric:        Speech: Speech normal.        Behavior: Behavior normal. Behavior is cooperative.     Assessment and Plan:   Cough No red flags on exam.  Will initiate augmentin for sinusitis per orders. Discussed taking medications as prescribed. Reviewed return precautions including worsening fever, SOB, worsening cough or other concerns. Push fluids and rest. I recommend that patient follow-up if symptoms worsen or persist despite treatment x 7-10 days, sooner if needed.  I,Alexander Ruley,acting as a Education administrator for [employer], PA.,have documented all relevant documentation on the behalf of [name], PA,as directed by  [name], PA while in the presence of [name], [place].  I, [name], [place], have reviewed all documentation for this visit. The documentation on 11/13/22 for the exam, diagnosis, procedures, and orders are all accurate and complete.  [name], PA-C

## 2022-11-21 ENCOUNTER — Telehealth: Payer: Self-pay | Admitting: Physician Assistant

## 2022-11-21 DIAGNOSIS — R051 Acute cough: Secondary | ICD-10-CM

## 2022-11-21 NOTE — Telephone Encounter (Signed)
Please see message and advise 

## 2022-11-21 NOTE — Telephone Encounter (Signed)
Pt is not any better & was told at last visit, next step is x-ray. She would like to go ahead & get it ordered. Please advise

## 2022-11-22 NOTE — Telephone Encounter (Signed)
Left message on voicemail to call office.  

## 2022-11-22 NOTE — Telephone Encounter (Signed)
Pt called back, told her order has been placed for Chest x-ray, you can go to [facility] office, address and operating times given to patient. Pt verbalized understanding.

## 2022-11-25 ENCOUNTER — Ambulatory Visit (INDEPENDENT_AMBULATORY_CARE_PROVIDER_SITE_OTHER)
Admission: RE | Admit: 2022-11-25 | Discharge: 2022-11-25 | Disposition: A | Discharge status: Home / Self Care | Payer: No Typology Code available for payment source | Source: Ambulatory Visit | Attending: Physician Assistant

## 2022-11-25 DIAGNOSIS — R051 Acute cough: Secondary | ICD-10-CM

## 2022-11-26 ENCOUNTER — Encounter: Payer: Self-pay | Admitting: Family Medicine

## 2022-11-26 ENCOUNTER — Ambulatory Visit: Payer: No Typology Code available for payment source | Admitting: Family Medicine

## 2022-11-26 ENCOUNTER — Ambulatory Visit: Payer: No Typology Code available for payment source | Admitting: Physician Assistant

## 2022-11-26 VITALS — BP 124/86 | HR 94 | Temp 97.9°F | Ht 64.0 in | Wt 166.2 lb

## 2022-11-26 DIAGNOSIS — J9801 Acute bronchospasm: Secondary | ICD-10-CM | POA: Diagnosis not present

## 2022-11-26 DIAGNOSIS — J309 Allergic rhinitis, unspecified: Secondary | ICD-10-CM

## 2022-11-26 DIAGNOSIS — U071 COVID-19: Secondary | ICD-10-CM

## 2022-11-26 MED ORDER — ALBUTEROL SULFATE HFA 108 (90 BASE) MCG/ACT IN AERS: 2.0000 | INHALATION_SPRAY | RESPIRATORY_TRACT | 2 refills | Status: AC | PRN

## 2022-11-26 MED ORDER — PREDNISONE 10 MG PO TABS: ORAL_TABLET | ORAL | 0 refills | Status: DC

## 2022-11-26 NOTE — Patient Instructions (Addendum)
Please follow up if symptoms do not improve or as needed.   Your chest xray was normal, without infection but you are wheezing on exam today.  Use the inhaler and take the prednisone as discussed.  CLINICAL DATA:  Ongoing cough after COVID   EXAM: CHEST - 2 VIEW   COMPARISON:  None Available.   FINDINGS: The heart size and mediastinal contours are within normal limits. Both lungs are clear. The visualized skeletal structures are unremarkable.   IMPRESSION: No active cardiopulmonary disease.     Electronically Signed   By: Dorise Bullion III M.D.   On: 11/25/2022 16:30
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
## 2022-11-28 ENCOUNTER — Other Ambulatory Visit: Payer: Self-pay | Admitting: Physician Assistant

## 2022-11-28 NOTE — Progress Notes (Signed)
Jacqueline Jimenez D.[place] Phone: [phone]   Assessment and Plan:     1. Chronic bilateral low back pain with right-sided sciatica  - Chronic with exacerbation, subsequent visit - Continued low back pain with right-sided radicular symptoms.  Unclear if radicular symptoms are from lumbar or gluteal origin.  Based on mild degenerative changes on x-ray from 03/26/2022, patient's physical exam, failure to improve with >6 weeks of conservative therapy, pain frequently >6/10, pain affecting day-to-day activities, we will proceed with lumbar MRI for further evaluation - OMT not performed today as I would like to await MRI report first - Continue prednisone Dosepak - Restart physical therapy.  New referral provided today  Pertinent previous records reviewed include none   Follow Up: 3 days after MRI to review results and discuss treatment plan.  Could consider epidural versus OMT based on results and patient's presentation   Subjective:   I, [name], am serving as a Education administrator for Doctor Glennon Mac   Chief Complaint: low back pain    HPI:    04/11/2022 Patient is a 47 year old female complaining of low back pain. Patient states back pain that has been onset for the past 1 week. Located on right lower back. States pain radiates down to her hip and right leg. 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 She has been on her feet for long periods of time and thinks this could be contributing. States pain started to radiates down into hip and leg on Thursday. She has had increased pain on Friday which she rate 9 out of 10 on a scale. Worse with certain motions. She reports she feel this pain when standing or siting. She has tried Ibuprofen 800 mg daily to help alleviate her symptoms. Symptoms hurts worse with standing. It is tender to touch. Symptoms started to get  improved during the weekend. The prednisone and a weekend off work really helped with the pain. Patient states she works 12 hour shifts and is on her feet all day. Wondering if she need lifts or different shoes or OMT.    05/09/2022 Patient states that she is hurting at work    06/06/2022 Patient states she hasnt worked so she is feeling good  11/29/2022 Patient states prednisone is healing but sciatica is still progressing , still using the CBD cream     Relevant Historical Information: None pertinent  Additional pertinent review of systems negative.   Current Outpatient Medications:    albuterol (VENTOLIN HFA) 108 (90 Base) MCG/ACT inhaler, Inhale 2 puffs into the lungs every 4 (four) hours as needed for wheezing or shortness of breath., Disp: 1 each, Rfl: 2   escitalopram (LEXAPRO) 5 MG tablet, Take 1 tablet by mouth once daily, Disp: 30 tablet, Rfl: 2   fluticasone (FLONASE) 50 MCG/ACT nasal spray, Place 1 spray into both nostrils daily., Disp: , Rfl:    ibuprofen (ADVIL,MOTRIN) 200 MG tablet, Take 400 mg by mouth every 6 (six) hours as needed for moderate pain., Disp: , Rfl:    levocetirizine (XYZAL) 5 MG tablet, Take 1 tablet (5 mg total) by mouth every evening., Disp: 90 tablet, Rfl: 3   LORazepam (ATIVAN) 0.5 MG tablet, TAKE 1 TABLET BY MOUTH AS NEEDED UP TO ONCE DAILY FOR ANXIETY, Disp: 20 tablet, Rfl: 0   methadone (DOLOPHINE) 10 MG/ML solution, Take 120 mg by mouth  daily. [facility], Disp: , Rfl:    montelukast (SINGULAIR) 10 MG tablet, Take 1 tablet (10 mg total) by mouth at bedtime., Disp: 90 tablet, Rfl: 3   predniSONE (DELTASONE) 10 MG tablet, Take 4 tabs qd x 2 days, 3 qd x 2 days, 2 qd x 2d, 1qd x 3 days, Disp: 21 tablet, Rfl: 0   promethazine (PHENERGAN) 12.5 MG tablet, Take 1 tablet (12.5 mg total) by mouth every 8 (eight) hours as needed for nausea or vomiting., Disp: 20 tablet, Rfl: 0   Tirzepatide-Weight Management (ZEPBOUND) 2.5 MG/0.5ML SOAJ, Inject  2.5 mg into the skin once a week., Disp: 2 mL, Rfl: 0   Objective:     Vitals:   11/29/22 1108  BP: 130/78  Pulse: 96  SpO2: 100%  Weight: 165 lb (74.8 kg)  Height: 5' 4"$  (1.626 m)      Body mass index is 28.32 kg/m.    Physical Exam:    Gen: Appears well, nad, nontoxic and pleasant Psych: Alert and oriented, appropriate mood and affect Neuro: sensation intact, strength is 5/5 in upper and lower extremities, muscle tone wnl Skin: no susupicious lesions or rashes  Back - Normal skin, Spine with normal alignment and no deformity.   No tenderness to vertebral process palpation.   Bilateral lumbar paraspinous muscles are moderately tender and without spasm.  Worse on right compared to left Bilaterally TTP gluteal musculature Straight leg raise positive right, negative left Trendelenberg positive right, negative left Piriformis Test positive for pain bilaterally   Electronically signed by:  Jacqueline Jimenez D.[name] 11:27 AM 11/29/22

## 2022-11-29 ENCOUNTER — Ambulatory Visit (INDEPENDENT_AMBULATORY_CARE_PROVIDER_SITE_OTHER): Payer: No Typology Code available for payment source | Admitting: Sports Medicine

## 2022-11-29 VITALS — BP 130/78 | HR 96 | Ht 64.0 in | Wt 165.0 lb

## 2022-11-29 DIAGNOSIS — M5441 Lumbago with sciatica, right side: Secondary | ICD-10-CM | POA: Diagnosis not present

## 2022-11-29 DIAGNOSIS — G8929 Other chronic pain: Secondary | ICD-10-CM

## 2022-11-29 NOTE — Telephone Encounter (Signed)
Pt requesting refill for Ativan. Last  OV 11/26/2022.

## 2022-11-29 NOTE — Patient Instructions (Addendum)
Good to see you MRI lumbar  Pt referral Follow up 3 days after MRI to discuss results

## 2022-12-17 ENCOUNTER — Telehealth: Payer: Self-pay | Admitting: Sports Medicine

## 2022-12-17 ENCOUNTER — Other Ambulatory Visit: Payer: Self-pay | Admitting: Sports Medicine

## 2022-12-17 NOTE — Telephone Encounter (Signed)
MRI scheduled for 3/2, pt would like something short acting like xanax to help her through the MRI. [facility].

## 2022-12-21 ENCOUNTER — Ambulatory Visit
Admission: RE | Admit: 2022-12-21 | Discharge: 2022-12-21 | Disposition: A | Discharge status: Home / Self Care | Payer: No Typology Code available for payment source | Source: Ambulatory Visit | Attending: Sports Medicine

## 2022-12-21 DIAGNOSIS — G8929 Other chronic pain: Secondary | ICD-10-CM

## 2022-12-23 NOTE — Progress Notes (Unsigned)
Jacqueline Jimenez D.[place] Phone: [phone]   Assessment and Plan:     There are no diagnoses linked to this encounter.  ***   Pertinent previous records reviewed include ***   Follow Up: ***     Subjective:   I, Jacqueline Jimenez, am serving as a Education administrator for Doctor Glennon Mac   Chief Complaint: low back pain    HPI:    04/11/2022 Patient is a 47 year old female complaining of low back pain. Patient states back pain that has been onset for the past 1 week. Located on right lower back. States pain radiates down to her hip and right leg. 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 She has been on her feet for long periods of time and thinks this could be contributing. States pain started to radiates down into hip and leg on Thursday. She has had increased pain on Friday which she rate 9 out of 10 on a scale. Worse with certain motions. She reports she feel this pain when standing or siting. She has tried Ibuprofen 800 mg daily to help alleviate her symptoms. Symptoms hurts worse with standing. It is tender to touch. Symptoms started to get improved during the weekend. The prednisone and a weekend off work really helped with the pain. Patient states she works 12 hour shifts and is on her feet all day. Wondering if she need lifts or different shoes or OMT.    05/09/2022 Patient states that she is hurting at work    06/06/2022 Patient states she hasnt worked so she is feeling good   11/29/2022 Patient states prednisone is healing but sciatica is still progressing , still using the CBD cream    12/24/2022 Patient states   Relevant Historical Information: None pertinent  Additional pertinent review of systems negative.   Current Outpatient Medications:    albuterol (VENTOLIN HFA) 108 (90 Base) MCG/ACT inhaler, Inhale 2 puffs into the lungs every 4 (four) hours  as needed for wheezing or shortness of breath., Disp: 1 each, Rfl: 2   escitalopram (LEXAPRO) 5 MG tablet, Take 1 tablet by mouth once daily, Disp: 30 tablet, Rfl: 2   fluticasone (FLONASE) 50 MCG/ACT nasal spray, Place 1 spray into both nostrils daily., Disp: , Rfl:    ibuprofen (ADVIL,MOTRIN) 200 MG tablet, Take 400 mg by mouth every 6 (six) hours as needed for moderate pain., Disp: , Rfl:    levocetirizine (XYZAL) 5 MG tablet, Take 1 tablet (5 mg total) by mouth every evening., Disp: 90 tablet, Rfl: 3   LORazepam (ATIVAN) 0.5 MG tablet, TAKE 1 TABLET BY MOUTH AS NEEDED UP TO ONCE DAILY FOR ANXIETY, Disp: 20 tablet, Rfl: 0   methadone (DOLOPHINE) 10 MG/ML solution, Take 120 mg by mouth daily. [facility], Disp: , Rfl:    montelukast (SINGULAIR) 10 MG tablet, Take 1 tablet (10 mg total) by mouth at bedtime., Disp: 90 tablet, Rfl: 3   predniSONE (DELTASONE) 10 MG tablet, Take 4 tabs qd x 2 days, 3 qd x 2 days, 2 qd x 2d, 1qd x 3 days, Disp: 21 tablet, Rfl: 0   promethazine (PHENERGAN) 12.5 MG tablet, Take 1 tablet (12.5 mg total) by mouth every 8 (eight) hours as needed for nausea or vomiting., Disp: 20 tablet, Rfl: 0   Tirzepatide-Weight Management (ZEPBOUND) 2.5 MG/0.5ML SOAJ, Inject 2.5 mg into  the skin once a week., Disp: 2 mL, Rfl: 0   Objective:     There were no vitals filed for this visit.    There is no height or weight on file to calculate BMI.    Physical Exam:    ***   Electronically signed by:  Jacqueline Jimenez D.[name] 12:01 PM 12/23/22

## 2022-12-24 ENCOUNTER — Other Ambulatory Visit: Payer: Self-pay | Admitting: Physician Assistant

## 2022-12-24 ENCOUNTER — Ambulatory Visit (INDEPENDENT_AMBULATORY_CARE_PROVIDER_SITE_OTHER): Payer: No Typology Code available for payment source | Admitting: Sports Medicine

## 2022-12-24 VITALS — HR 108 | Ht 64.0 in | Wt 164.0 lb

## 2022-12-24 DIAGNOSIS — M5126 Other intervertebral disc displacement, lumbar region: Secondary | ICD-10-CM | POA: Diagnosis not present

## 2022-12-24 DIAGNOSIS — G8929 Other chronic pain: Secondary | ICD-10-CM | POA: Diagnosis not present

## 2022-12-24 DIAGNOSIS — M5441 Lumbago with sciatica, right side: Secondary | ICD-10-CM | POA: Diagnosis not present

## 2022-12-24 NOTE — Patient Instructions (Addendum)
Good to see you Epidural right L5-S1 Follow up 2 weeks after Work note provided

## 2023-01-15 ENCOUNTER — Ambulatory Visit (INDEPENDENT_AMBULATORY_CARE_PROVIDER_SITE_OTHER): Payer: No Typology Code available for payment source | Admitting: Dermatology

## 2023-01-15 ENCOUNTER — Encounter: Payer: Self-pay | Admitting: Dermatology

## 2023-01-15 VITALS — BP 109/71 | HR 75

## 2023-01-15 DIAGNOSIS — D1801 Hemangioma of skin and subcutaneous tissue: Secondary | ICD-10-CM

## 2023-01-15 DIAGNOSIS — Z1283 Encounter for screening for malignant neoplasm of skin: Secondary | ICD-10-CM

## 2023-01-15 DIAGNOSIS — D229 Melanocytic nevi, unspecified: Secondary | ICD-10-CM

## 2023-01-15 DIAGNOSIS — L814 Other melanin hyperpigmentation: Secondary | ICD-10-CM | POA: Diagnosis not present

## 2023-01-15 DIAGNOSIS — L821 Other seborrheic keratosis: Secondary | ICD-10-CM

## 2023-01-15 DIAGNOSIS — L578 Other skin changes due to chronic exposure to nonionizing radiation: Secondary | ICD-10-CM

## 2023-01-15 NOTE — Patient Instructions (Addendum)
Recommend cerave cream daily to feet Recommend urea 20 - 40 % cream can purchase online apply to feet every other night     Seborrheic Keratosis  What causes seborrheic keratoses? Seborrheic keratoses are harmless, common skin growths that first appear during adult life.  As time goes by, more growths appear.  Some people may develop a large number of them.  Seborrheic keratoses appear on both covered and uncovered body parts.  They are not caused by sunlight.  The tendency to develop seborrheic keratoses can be inherited.  They vary in color from skin-colored to gray, brown, or even black.  They can be either smooth or have a rough, warty surface.   Seborrheic keratoses are superficial and look as if they were stuck on the skin.  Under the microscope this type of keratosis looks like layers upon layers of skin.  That is why at times the top layer may seem to fall off, but the rest of the growth remains and re-grows.    Treatment Seborrheic keratoses do not need to be treated, but can easily be removed in the office.  Seborrheic keratoses often cause symptoms when they rub on clothing or jewelry.  Lesions can be in the way of shaving.  If they become inflamed, they can cause itching, soreness, or burning.  Removal of a seborrheic keratosis can be accomplished by freezing, burning, or surgery. If any spot bleeds, scabs, or grows rapidly, please return to have it checked, as these can be an indication of a skin cancer.    Melanoma ABCDEs  Melanoma is the most dangerous type of skin cancer, and is the leading cause of death from skin disease.  You are more likely to develop melanoma if you: Have light-colored skin, light-colored eyes, or red or blond hair Spend a lot of time in the sun Tan regularly, either outdoors or in a tanning bed Have had blistering sunburns, especially during childhood Have a close family member who has had a melanoma Have atypical moles or large birthmarks  Early  detection of melanoma is key since treatment is typically straightforward and cure rates are extremely high if we catch it early.   The first sign of melanoma is often a change in a mole or a new dark spot.  The ABCDE system is a way of remembering the signs of melanoma.  A for asymmetry:  The two halves do not match. B for border:  The edges of the growth are irregular. C for color:  A mixture of colors are present instead of an even brown color. D for diameter:  Melanomas are usually (but not always) greater than 68mm - the size of a pencil eraser. E for evolution:  The spot keeps changing in size, shape, and color.  Please check your skin once per month between visits. You can use a small mirror in front and a large mirror behind you to keep an eye on the back side or your body.   If you see any new or changing lesions before your next follow-up, please call to schedule a visit.  Please continue daily skin protection including broad spectrum sunscreen SPF 30+ to sun-exposed areas, reapplying every 2 hours as needed when you're outdoors.   Staying in the shade or wearing long sleeves, sun glasses (UVA+UVB protection) and wide brim hats (4-inch brim around the entire circumference of the hat) are also recommended for sun protection.     Due to recent changes in healthcare laws, you may  see results of your pathology and/or laboratory studies on MyChart before the doctors have had a chance to review them. We understand that in some cases there may be results that are confusing or concerning to you. Please understand that not all results are received at the same time and often the doctors may need to interpret multiple results in order to provide you with the best plan of care or course of treatment. Therefore, we ask that you please give [place] 2 business days to thoroughly review all your results before contacting the office for clarification. Should we see a critical lab result, you will be contacted  sooner.   If You Need Anything After Your Visit  If you have any questions or concerns for your doctor, please call our main line at [phone] and press option 4 to reach your doctor's medical assistant. If no one answers, please leave a voicemail as directed and we will return your call as soon as possible. Messages left after 4 pm will be answered the following business day.   You may also send [place] a message via [place]. We typically respond to MyChart messages within 1-2 business days.  For prescription refills, please ask your pharmacy to contact our office. Our fax number is [phone].  If you have an urgent issue when the clinic is closed that cannot wait until the next business day, you can page your doctor at the number below.    Please note that while we do our best to be available for urgent issues outside of office hours, we are not available 24/7.   If you have an urgent issue and are unable to reach [place], you may choose to seek medical care at your doctor's office, retail clinic, urgent care center, or emergency room.  If you have a medical emergency, please immediately call 911 or go to the emergency department.  Pager Numbers  - Dr. Nehemiah Massed: [phone]  - Dr. Laurence Ferrari: [phone]  - Dr. Nicole Kindred: [phone]  In the event of inclement weather, please call our main line at [phone] for an update on the status of any delays or closures.  Dermatology Medication Tips: Please keep the boxes that topical medications come in in order to help keep track of the instructions about where and how to use these. Pharmacies typically print the medication instructions only on the boxes and not directly on the medication tubes.   If your medication is too expensive, please contact our office at [phone] option 4 or send [place] a message through [place].   We are unable to tell what your co-pay for medications will be in advance as this is different depending on your insurance  coverage. However, we may be able to find a substitute medication at lower cost or fill out paperwork to get insurance to cover a needed medication.   If a prior authorization is required to get your medication covered by your insurance company, please allow [place] 1-2 business days to complete this process.  Drug prices often vary depending on where the prescription is filled and some pharmacies may offer cheaper prices.  The website www.goodrx.com contains coupons for medications through different pharmacies. The prices here do not account for what the cost may be with help from insurance (it may be cheaper with your insurance), but the website can give you the price if you did not use any insurance.  - You can print the associated coupon and take it with your prescription to the pharmacy.  - You  may also stop by our office during regular business hours and pick up a GoodRx coupon card.  - If you need your prescription sent electronically to a different pharmacy, notify our office through [facility] or by phone at [phone] option 4.     Si Usted Necesita Algo Despus de Su Visita  Tambin puede enviarnos un mensaje a travs de Pharmacist, community. Por lo general respondemos a los mensajes de MyChart en el transcurso de 1 a 2 das hbiles.  Para renovar recetas, por favor pida a su farmacia que se ponga en contacto con nuestra oficina. Harland Dingwall de fax es [place] [phone].  Si tiene un asunto urgente cuando la clnica est cerrada y que no puede esperar hasta el siguiente da hbil, puede llamar/localizar a su doctor(a) al nmero que aparece a continuacin.   Por favor, tenga en cuenta que aunque hacemos todo lo posible para estar disponibles para asuntos urgentes fuera del horario de [place], no estamos disponibles las 24 horas del da, los 7 das de la [place].   Si tiene un problema urgente y no puede comunicarse con nosotros, puede optar por buscar atencin mdica  en el consultorio de  su doctor(a), en una clnica privada, en un centro de atencin urgente o en una sala de emergencias.  Si tiene Engineering geologist, por favor llame inmediatamente al 911 o vaya a la sala de emergencias.  Nmeros de bper  - Dr. Nehemiah Massed: [phone]  - Dra. Moye: [phone]  - Dra. Nicole Kindred: [phone]  En caso de inclemencias del [place], por favor llame a Johnsie Kindred principal al [phone] para una actualizacin sobre el [place] de cualquier retraso o cierre.  Consejos para la medicacin en dermatologa: Por favor, guarde las cajas en las que vienen los medicamentos de uso tpico para ayudarle a seguir las instrucciones sobre dnde y cmo usarlos. Las farmacias generalmente imprimen las instrucciones del medicamento slo en las cajas y no directamente en los tubos del [place].   Si su medicamento es muy caro, por favor, pngase en contacto con Zigmund Daniel llamando al [phone] y presione la opcin 4 o envenos un mensaje a travs de Pharmacist, community.   No podemos decirle cul ser su copago por los medicamentos por adelantado ya que esto es diferente dependiendo de la cobertura de su seguro. Sin embargo, es posible que podamos encontrar un medicamento sustituto a Electrical engineer un formulario para que el seguro cubra el medicamento que se considera necesario.   Si se requiere una autorizacin previa para que su compaa de seguros [place] su medicamento, por favor permtanos de 1 a 2 das hbiles para completar este proceso.  Los precios de los medicamentos varan con frecuencia dependiendo del Environmental consultant de dnde se surte la receta y alguna farmacias pueden ofrecer precios ms baratos.  El sitio web www.goodrx.com tiene cupones para medicamentos de Airline pilot. Los precios aqu no tienen en cuenta lo que podra costar con la ayuda del seguro (puede ser ms barato con su seguro), pero el sitio web puede darle el precio si no utiliz Research scientist (physical sciences).  - Puede imprimir el  cupn correspondiente y llevarlo con su receta a la farmacia.  - Tambin puede pasar por nuestra oficina durante el horario de atencin regular y Charity fundraiser una tarjeta de cupones de GoodRx.  - Si necesita que su receta se enve electrnicamente a Chiropodist, informe a nuestra oficina a travs de MyChart de [place] o por telfono llamando al [phone] y presione  la opcin 4.

## 2023-01-15 NOTE — Progress Notes (Signed)
   Follow-Up Visit   Subjective  Jacqueline Jimenez is a 47 y.o. female who presents for the following: Skin Cancer Screening and Full Body Skin Exam New patient would like all over check no history of skin cancer.  The patient presents for Total-Body Skin Exam (TBSE) for skin cancer screening and mole check. The patient has spots, moles and lesions to be evaluated, some may be new or changing and the patient has concerns that these could be cancer.  The following portions of the chart were reviewed this encounter and updated as appropriate: medications, allergies, medical history  Review of Systems:  No other skin or systemic complaints except as noted in HPI or Assessment and Plan.  Objective  Well appearing patient in no apparent distress; mood and affect are within normal limits.  A full examination was performed including scalp, head, eyes, ears, nose, lips, neck, chest, axillae, abdomen, back, buttocks, bilateral upper extremities, bilateral lower extremities, hands, feet, fingers, toes, fingernails, and toenails. All findings within normal limits unless otherwise noted below.   Relevant physical exam findings are noted in the Assessment and Plan.   Assessment & Plan   LENTIGINES, SEBORRHEIC KERATOSES, HEMANGIOMAS - Benign normal skin lesions - Benign-appearing - Call for any changes  MELANOCYTIC NEVI - Tan-brown and/or pink-flesh-colored symmetric macules and papules - Benign appearing on exam today - Observation - Call clinic for new or changing moles - Recommend daily use of broad spectrum spf 30+ sunscreen to sun-exposed areas.   ACTINIC DAMAGE - Chronic condition, secondary to cumulative UV/sun exposure - diffuse scaly erythematous macules with underlying dyspigmentation - Recommend daily broad spectrum sunscreen SPF 30+ to sun-exposed areas, reapply every 2 hours as needed.  - Staying in the shade or wearing long sleeves, sun glasses (UVA+UVB protection) and wide brim  hats (4-inch brim around the entire circumference of the hat) are also recommended for sun protection.  - Call for new or changing lesions.  SKIN CANCER SCREENING PERFORMED TODAY.  Return for 2 - 3 year tbse.  IRuthell Rummage, CMA, am acting as scribe for Sarina Ser, MD.  Documentation: I have reviewed the above documentation for accuracy and completeness, and I agree with the above.  Sarina Ser, MD

## 2023-01-22 NOTE — Progress Notes (Signed)
Jacqueline Jimenez is a 47 y.o. female here for a follow up of a pre-existing problem.  History of Present Illness:   Chief Complaint  Patient presents with   Anxiety   Depression    HPI  Anxiety and Depression Taking escitalopram 5 mg daily, lorazepan 0.5 mg daily as needed. Tolerating well without negative side effects. Denies worsening anxiety, depression, chest pain. Requesting refill of lorazepam.  Allergies Treated with levocetirizine 5 mg daily in the evening, montelukast 10 mg at bedtime, fluticasone 50 mcg daily. Still having some eye itching while taking these.   Overweight/Hepatic Steatosis Tried Zepbound but cannot continue due to high cost.  Chronic Back Pain Having difficulty scheduling an epidural -- was referred to a place where they do not ex Frequently on her feet for her job and continues to have back pain. Followed by sports medicine specialist, Dr. Jean Rosenthal. MRI lumbar spine w/out contrast showed broad-based left foraminal to extraforaminal disc protrusion at L4-5, small central disc protrusion at L5-S1, mild bilateral facet hypertrophy at L3-4 through L5-S1.   Past Medical History:  Diagnosis Date   Allergy    seasonal   Anxiety    Arthritis    Blood type, Rh negative    Depression    History of chicken pox    PID (pelvic inflammatory disease)    require hospitalizaiton at age 80   Pyloric stenosis    as an infant     Social History   Tobacco Use   Smoking status: Former    Packs/day: 0.25    Years: 11.00    Additional pack years: 0.00    Total pack years: 2.75    Types: Cigarettes    Quit date: 2006    Years since quitting: 18.2   Smokeless tobacco: Never  Vaping Use   Vaping Use: Never used  Substance Use Topics   Alcohol use: Not Currently    Comment: Sober times 2 days    Drug use: Not Currently    Types: Other-see comments    Comment: Opiates    Past Surgical History:  Procedure Laterality Date   APPENDECTOMY     calf  augmentation     CESAREAN SECTION     club foot correction     DILATION AND CURETTAGE OF UTERUS     HERNIA REPAIR     RHINOPLASTY      Family History  Problem Relation Age of Onset   Asthma Mother    Breast cancer Mother 73       survivor   Hypertension Father    Diabetes Maternal Grandmother    Breast cancer Maternal Grandmother 62   Breast cancer Other        unknown age and pased away    No Known Allergies  Current Medications:   Current Outpatient Medications:    albuterol (VENTOLIN HFA) 108 (90 Base) MCG/ACT inhaler, Inhale 2 puffs into the lungs every 4 (four) hours as needed for wheezing or shortness of breath., Disp: 1 each, Rfl: 2   escitalopram (LEXAPRO) 5 MG tablet, Take 1 tablet by mouth once daily, Disp: 30 tablet, Rfl: 2   fluticasone (FLONASE) 50 MCG/ACT nasal spray, Place 1 spray into both nostrils daily., Disp: , Rfl:    ibuprofen (ADVIL,MOTRIN) 200 MG tablet, Take 400 mg by mouth every 6 (six) hours as needed for moderate pain., Disp: , Rfl:    levocetirizine (XYZAL) 5 MG tablet, Take 1 tablet (5 mg total) by mouth every evening.,  Disp: 90 tablet, Rfl: 3   LORazepam (ATIVAN) 0.5 MG tablet, TAKE 1 TABLET BY MOUTH AS NEEDED UP TO ONCE DAILY FOR ANXIETY, Disp: 20 tablet, Rfl: 0   methadone (DOLOPHINE) 10 MG/ML solution, Take 120 mg by mouth daily. [facility], Disp: , Rfl:    montelukast (SINGULAIR) 10 MG tablet, Take 1 tablet (10 mg total) by mouth at bedtime., Disp: 90 tablet, Rfl: 3   promethazine (PHENERGAN) 12.5 MG tablet, TAKE 1 TABLET BY MOUTH EVERY 8 HOURS AS NEEDED FOR NAUSEA FOR VOMITING, Disp: 20 tablet, Rfl: 0   Review of Systems:   Review of Systems  Constitutional:  Negative for fever and malaise/fatigue.  HENT:  Negative for congestion.   Eyes:  Negative for blurred vision.       (+) Itching  Respiratory:  Negative for cough and shortness of breath.   Cardiovascular:  Negative for chest pain, palpitations and leg swelling.   Gastrointestinal:  Negative for vomiting.  Musculoskeletal:  Positive for back pain.  Skin:  Negative for rash.  Neurological:  Negative for loss of consciousness and headaches.    Vitals:   Vitals:   01/29/23 1309  BP: 130/86  Pulse: 72  Temp: (!) 97.1 F (36.2 C)  TempSrc: Temporal  SpO2: 97%  Weight: 166 lb (75.3 kg)  Height: 5\' 4"  (1.626 m)     Body mass index is 28.49 kg/m.  Physical Exam:   Physical Exam Vitals and nursing note reviewed.  Constitutional:      General: She is not in acute distress.    Appearance: She is well-developed. She is not ill-appearing or toxic-appearing.  Cardiovascular:     Rate and Rhythm: Normal rate and regular rhythm.     Pulses: Normal pulses.     Heart sounds: Normal heart sounds, S1 normal and S2 normal.  Pulmonary:     Effort: Pulmonary effort is normal.     Breath sounds: Normal breath sounds.  Skin:    General: Skin is warm and dry.  Neurological:     Mental Status: She is alert.     GCS: GCS eye subscore is 4. GCS verbal subscore is 5. GCS motor subscore is 6.  Psychiatric:        Speech: Speech normal.        Behavior: Behavior normal. Behavior is cooperative.     Assessment and Plan:   Anxious depression Stable Continue lexapro 5 mg daily Continue prn ativan 0.5 mg daily prn Follow-up in 6 months, sooner if concerns  Special screening for malignant neoplasms, colon Colonoscopy referral  Chronic allergic rhinitis Overall stable Continue xyzal, flonase and singulair Follow-up prn  Chronic midline low back pain with right-sided sciatica No red flags Continue management with sports medicine   I,Alexander Ruley,acting as a scribe for [employer], PA.,have documented all relevant documentation on the behalf of Jarold Motto, PA,as directed by  Jarold Motto, PA while in the presence of Jarold Motto, [place].   I, Jarold Motto, [place], have reviewed all documentation for this visit. The documentation  on 01/29/23 for the exam, diagnosis, procedures, and orders are all accurate and complete.   Jarold Motto, PA-C

## 2023-01-24 ENCOUNTER — Other Ambulatory Visit: Payer: Self-pay | Admitting: Physician Assistant

## 2023-01-29 ENCOUNTER — Encounter: Payer: Self-pay | Admitting: Physician Assistant

## 2023-01-29 ENCOUNTER — Ambulatory Visit: Payer: No Typology Code available for payment source | Admitting: Physician Assistant

## 2023-01-29 VITALS — BP 130/86 | HR 72 | Temp 97.1°F | Ht 64.0 in | Wt 166.0 lb

## 2023-01-29 DIAGNOSIS — Z1211 Encounter for screening for malignant neoplasm of colon: Secondary | ICD-10-CM

## 2023-01-29 DIAGNOSIS — F418 Other specified anxiety disorders: Secondary | ICD-10-CM

## 2023-01-29 DIAGNOSIS — M5441 Lumbago with sciatica, right side: Secondary | ICD-10-CM | POA: Diagnosis not present

## 2023-01-29 DIAGNOSIS — J309 Allergic rhinitis, unspecified: Secondary | ICD-10-CM | POA: Diagnosis not present

## 2023-01-29 DIAGNOSIS — G8929 Other chronic pain: Secondary | ICD-10-CM

## 2023-01-29 NOTE — Patient Instructions (Signed)
It was great to see you!  Continue medications as prescribed  I have placed order for colonoscopy -- they should reach out to you to schedule or send you mychart message  Take care,  Jarold Motto PA-C

## 2023-02-03 ENCOUNTER — Telehealth: Payer: Self-pay | Admitting: Physician Assistant

## 2023-02-03 NOTE — Telephone Encounter (Signed)
See Triage note. Pt scheduled for Wed 4/17.

## 2023-02-03 NOTE — Telephone Encounter (Signed)
Advised to see pcp within 2 weeks. Scheduled 02/05/23 with Bufford Buttner.  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 Translation No Nurse Assessment Nurse: Chestine Spore, RN, Marchelle Folks Date/Time (Eastern Time): 01/31/2023 7:24:58 PM Confirm and document reason for call. If symptomatic, describe symptoms. ---Caller states she just got her period for the 3rd time in a month and a half. It started again today. Denies abd pain Does the patient have any new or worsening symptoms? ---Yes Will a triage be completed? ---Yes Related visit to physician within the last 2 weeks? ---No Does the PT have any chronic conditions? (i.e. diabetes, asthma, this includes High risk factors for pregnancy, etc.) ---No Is the patient pregnant or possibly pregnant? (Ask all females between the ages of 85-55) ---No Is this a behavioral health or substance abuse call? ---No Guidelines Guideline Title Affirmed Question Affirmed Notes Nurse Date/Time (Eastern Time) Vaginal Bleeding - Abnormal [1] Menstrual cycle < 21 days OR > 35 days AND [2] occurs more than two cycles (2 months) this past year Mariane Masters 01/31/2023 7:26:46 PM Disp. Time Lamount Cohen Time) Disposition Final User 01/31/2023 7:33:46 PM See PCP within 2 Weeks Yes Chestine Spore, RN, Marchelle Folks Final  Disposition 01/31/2023 7:33:46 PM See PCP within 2 Weeks Yes Chestine Spore, RN, Earnestine Leys Disagree/Comply Comply Caller Understands Yes PreDisposition Search internet for information Care Advice Given Per Guideline SEE PCP WITHIN 2 WEEKS: * You need to be seen for this ongoing problem within the next 2 weeks. DIARY: * Keep a record for your doctor (or NP/PA) of the days you have any bleeding or spotting. CALL BACK IF: * Severe abdomen pain or dizziness occurs * Bleeding increases * You become worse CARE ADVICE given per Vaginal Bleeding, Abnormal (Adult) guideline. Referrals REFERRED TO PCP OFFICE

## 2023-02-05 ENCOUNTER — Ambulatory Visit: Payer: No Typology Code available for payment source | Admitting: Physician Assistant

## 2023-02-06 ENCOUNTER — Ambulatory Visit: Payer: No Typology Code available for payment source | Admitting: *Deleted

## 2023-02-06 VITALS — Ht 64.0 in | Wt 160.0 lb

## 2023-02-06 DIAGNOSIS — Z1211 Encounter for screening for malignant neoplasm of colon: Secondary | ICD-10-CM

## 2023-02-06 MED ORDER — NA SULFATE-K SULFATE-MG SULF 17.5-3.13-1.6 GM/177ML PO SOLN: 1.0000 | Freq: Once | ORAL | 0 refills | Status: AC

## 2023-02-06 NOTE — Progress Notes (Signed)
Pt's pre-visit is done over the phone and all paperwork (prep instructions) sent to patient. Pt's name and DOB verified at the beginning of the pre-visit. Pt denies any difficulty with ambulating.  No egg or soy allergy known to patient  No issues known to pt with past sedation with any surgeries or procedures Pt denies having issues being intubated Pt has no issues moving head neck or swallowing No FH of Malignant Hyperthermia Pt is not on diet pills Pt is not on home 02  Pt is not on blood thinners  Pt denies issues with constipation  Pt is not on dialysis Pt denies any upcoming cardiac testing Pt encouraged to use to use Singlecare or Goodrx to reduce cost  Patient's chart reviewed by Cathlyn Parsons CNRA prior to pre-visit and patient appropriate for the [facility].  Pre-visit completed and red dot placed by patient's name on their procedure day (on provider's schedule).  . Visit by phone Pt states  weight is 160lb Instructions reviewed with pt and pt states understanding. Instructed to review again prior to procedure. Pt states they will.  Instructions sent by mail with coupon and by mail Opioid dependent

## 2023-02-10 ENCOUNTER — Ambulatory Visit: Payer: No Typology Code available for payment source | Admitting: Physician Assistant

## 2023-02-12 ENCOUNTER — Encounter: Payer: Self-pay | Admitting: Internal Medicine

## 2023-02-19 ENCOUNTER — Encounter: Payer: Self-pay | Admitting: Internal Medicine

## 2023-02-19 ENCOUNTER — Ambulatory Visit: Payer: No Typology Code available for payment source | Admitting: Internal Medicine

## 2023-02-19 VITALS — BP 146/95 | HR 88 | Temp 98.6°F | Resp 10 | Ht 64.0 in | Wt 160.0 lb

## 2023-02-19 DIAGNOSIS — Z1211 Encounter for screening for malignant neoplasm of colon: Secondary | ICD-10-CM | POA: Diagnosis present

## 2023-02-19 MED ORDER — SODIUM CHLORIDE 0.9 % IV SOLN: 500.0000 mL | Freq: Once | INTRAVENOUS | Status: DC

## 2023-02-19 NOTE — Progress Notes (Signed)
Pt's states no medical or surgical changes since previsit or office visit. 

## 2023-02-19 NOTE — Progress Notes (Signed)
Plenvu sample given and new instructions reviewed for 02-20-23 procedure.  Ambulatory GI referral put in

## 2023-02-19 NOTE — Progress Notes (Signed)
[facility] History and Physical   Primary Care Physician:  Jarold Motto, PA   Reason for Procedure:   Colon cancer screening  Plan:    colonoscopy     HPI: Jacqueline Jimenez is a 47 y.o. female for screening exam   Past Medical History:  Diagnosis Date   Allergy    seasonal   Anxiety    Arthritis    Blood type, Rh negative    Depression    History of chicken pox    PID (pelvic inflammatory disease)    require hospitalizaiton at age 60   Pyloric stenosis    as an infant   Substance abuse (HCC)     Past Surgical History:  Procedure Laterality Date   APPENDECTOMY     calf augmentation     CESAREAN SECTION     club foot correction     DILATION AND CURETTAGE OF UTERUS     HERNIA REPAIR     RHINOPLASTY      Prior to Admission medications   Medication Sig Start Date End Date Taking? Authorizing Provider  escitalopram (LEXAPRO) 5 MG tablet Take 1 tablet by mouth once daily 11/14/22  Yes Worley, [place], PA  fluticasone (FLONASE) 50 MCG/ACT nasal spray Place 1 spray into both nostrils daily.   Yes [provider]  levocetirizine (XYZAL) 5 MG tablet Take 1 tablet (5 mg total) by mouth every evening. 09/27/22  Yes Worley, Lelon Mast, PA  LORazepam (ATIVAN) 0.5 MG tablet TAKE 1 TABLET BY MOUTH AS NEEDED UP TO ONCE DAILY FOR ANXIETY 01/27/23  Yes Jarold Motto, PA  methadone (DOLOPHINE) 10 MG/ML solution Take 120 mg by mouth daily. [facility]   Yes [provider]  montelukast (SINGULAIR) 10 MG tablet Take 1 tablet (10 mg total) by mouth at bedtime. 09/27/22  Yes Jarold Motto, PA  albuterol (VENTOLIN HFA) 108 (90 Base) MCG/ACT inhaler Inhale 2 puffs into the lungs every 4 (four) hours as needed for wheezing or shortness of breath. 11/26/22   Willow Ora, MD  ibuprofen (ADVIL,MOTRIN) 200 MG tablet Take 400 mg by mouth every 6 (six) hours as needed for moderate pain. Patient not taking: Reported on 02/06/2023    [provider]    Current Outpatient Medications  Medication Sig Dispense Refill   escitalopram (LEXAPRO) 5 MG tablet Take 1 tablet by mouth once daily 30 tablet 2   fluticasone (FLONASE) 50 MCG/ACT nasal spray Place 1 spray into both nostrils daily.     levocetirizine (XYZAL) 5 MG tablet Take 1 tablet (5 mg total) by mouth every evening. 90 tablet 3   LORazepam (ATIVAN) 0.5 MG tablet TAKE 1 TABLET BY MOUTH AS NEEDED UP TO ONCE DAILY FOR ANXIETY 20 tablet 0   methadone (DOLOPHINE) 10 MG/ML solution Take 120 mg by mouth daily. [facility]     montelukast (SINGULAIR) 10 MG tablet Take 1 tablet (10 mg total) by mouth at bedtime. 90 tablet 3   albuterol (VENTOLIN HFA) 108 (90 Base) MCG/ACT inhaler Inhale 2 puffs into the lungs every 4 (four) hours as needed for wheezing or shortness of breath. 1 each 2   ibuprofen (ADVIL,MOTRIN) 200 MG tablet Take 400 mg by mouth every 6 (six) hours as needed for moderate pain. (Patient not taking: Reported on 02/06/2023)     Current Facility-Administered Medications  Medication Dose Route Frequency Provider Last Rate Last Admin   0.9 %  sodium chloride infusion  500 mL Intravenous  Once [name], MD        Allergies as of 02/19/2023   (No Known Allergies)    Family History  Problem Relation Age of Onset   Asthma Mother    Breast cancer Mother 6       survivor   Hypertension Father    Diabetes Maternal Grandmother    Breast cancer Maternal Grandmother 67   Breast cancer Other        unknown age and pased away   Colon polyps Neg Hx    Colon cancer Neg Hx    Esophageal cancer Neg Hx    Rectal cancer Neg Hx    Stomach cancer Neg Hx     Social History   Socioeconomic History   Marital status: Married    Spouse name: Not on file   Number of children: Not on file   Years of education: Not on file   Highest education level: Not on file  Occupational History   Not on file  Tobacco Use   Smoking status: Former     Packs/day: 0.25    Years: 11.00    Additional pack years: 0.00    Total pack years: 2.75    Types: Cigarettes    Quit date: 2006    Years since quitting: 18.3   Smokeless tobacco: Never  Vaping Use   Vaping Use: Never used  Substance and Sexual Activity   Alcohol use: Not Currently    Comment: 2001   Drug use: Not Currently    Types: Other-see comments    Comment: Opiates, on methadone   Sexual activity: Not Currently    Partners: Male    Birth control/protection: None    Comment: mirena  Other Topics Concern   Not on file  Social History Narrative   Separated   At [employer], studying Criminal Justice   Lives by herself   47 year old at [employer]   Social Determinants of Health   Financial Resource Strain: Not on file  Food Insecurity: Not on file  Transportation Needs: Not on file  Physical Activity: Not on file  Stress: Not on file  Social Connections: Not on file  Intimate Partner Violence: Not on file    Review of Systems:  All other review of systems negative except as mentioned in the HPI.  Physical Exam: Vital signs BP 125/70   Pulse 84   Temp 98.6 F (37 C)   Ht 5\' 4"  (1.626 m)   Wt 160 lb (72.6 kg)   LMP 02/02/2023 (Approximate) Comment: No chance of pregnancy per patient  SpO2 96%   BMI 27.46 kg/m   General:   Alert,  Well-developed, well-nourished, pleasant and cooperative in NAD Lungs:  Clear throughout to auscultation.   Heart:  Regular rate and rhythm; no murmurs, clicks, rubs,  or gallops. Abdomen:  Soft, nontender and nondistended. Normal bowel sounds.   Neuro/Psych:  Alert and cooperative. Normal mood and affect. A and O x 3   @Donnivan Villena  Sena Slate, MD, [facility] [phone] (pager) 02/19/2023 2:06 PM@

## 2023-02-19 NOTE — Patient Instructions (Addendum)
The preparation was not effective so we need to reschedule with a different, extra preparation.  I appreciate the opportunity to care for you. [name], MD, FACG  YOU HAD AN ENDOSCOPIC PROCEDURE TODAY AT THE [facility]:   Refer to the procedure report that was given to you for any specific questions about what was found during the examination.  If the procedure report does not answer your questions, please call your gastroenterologist to clarify.  If you requested that your care partner not be given the details of your procedure findings, then the procedure report has been included in a sealed envelope for you to review at your convenience later.  YOU SHOULD EXPECT: Some feelings of bloating in the abdomen. Passage of more gas than usual.  Walking can help get rid of the air that was put into your GI tract during the procedure and reduce the bloating. If you had a lower endoscopy (such as a colonoscopy or flexible sigmoidoscopy) you may notice spotting of blood in your stool or on the toilet paper. If you underwent a bowel prep for your procedure, you may not have a normal bowel movement for a few days.  Please Note:  You might notice some irritation and congestion in your nose or some drainage.  This is from the oxygen used during your procedure.  There is no need for concern and it should clear up in a day or so.  SYMPTOMS TO REPORT IMMEDIATELY:  Following lower endoscopy (colonoscopy or flexible sigmoidoscopy):  Excessive amounts of blood in the stool  Significant tenderness or worsening of abdominal pains  Swelling of the abdomen that is new, acute  Fever of 100F or higher  For urgent or emergent issues, a gastroenterologist can be reached at any hour by calling [phone]. Do not use MyChart messaging for urgent concerns.    DIET:  stay on clear liquids!!!   Drink plenty of fluids but you should avoid alcoholic beverages for 24 hours.  ACTIVITY:  You should  plan to take it easy for the rest of today and you should NOT DRIVE or use heavy machinery until tomorrow (because of the sedation medicines used during the test).    FOLLOW UP: Our staff will call the number listed on your records the next business day following your procedure.  We will call around 7:15- 8:00 am to check on you and address any questions or concerns that you may have regarding the information given to you following your procedure. If we do not reach you, we will leave a message.      SIGNATURES/CONFIDENTIALITY: You and/or your care partner have signed paperwork which will be entered into your electronic medical record.  These signatures attest to the fact that that the information above on your After Visit Summary has been reviewed and is understood.  Full responsibility of the confidentiality of this discharge information lies with you and/or your care-partner.

## 2023-02-19 NOTE — Op Note (Signed)
[facility] Patient Name: Jacqueline Jimenez Procedure Date: 02/19/2023 2:12 PM MRN: [id number] Endoscopist: [name] , MD, [id number] Age: 47 Referring MD:  Date of Birth: 1976/07/24 Gender: Female Account #: [id number] Procedure:                Colonoscopy Indications:              Screening for colorectal malignant neoplasm Medicines:                Monitored Anesthesia Care Procedure:                Pre-Anesthesia Assessment:                           - Prior to the procedure, a History and Physical                            was performed, and patient medications and                            allergies were reviewed. The patient's tolerance of                            previous anesthesia was also reviewed. The risks                            and benefits of the procedure and the sedation                            options and risks were discussed with the patient.                            All questions were answered, and informed consent                            was obtained. Prior Anticoagulants: The patient has                            taken no anticoagulant or antiplatelet agents. ASA                            Grade Assessment: II - A patient with mild systemic                            disease. After reviewing the risks and benefits,                            the patient was deemed in satisfactory condition to                            undergo the procedure.                           After obtaining informed consent, the colonoscope  was passed under direct vision. Throughout the                            procedure, the patient's blood pressure, pulse, and                            oxygen saturations were monitored continuously. The                            Olympus [id number]-[id number] (#[id number]) Colonoscope was                            introduced through the anus with the intention of                            advancing to the  cecum. The scope was advanced to                            the sigmoid colon before the procedure was aborted.                            Medications were given. The colonoscopy was                            performed with difficulty due to poor bowel prep.                            The patient tolerated the procedure well. The                            quality of the bowel preparation was poor. Scope In: 2:20:59 PM Scope Out: 2:21:49 PM Total Procedure Duration: 0 hours 0 minutes 50 seconds  Findings:                 The perianal and digital rectal examinations were                            normal.                           Solid stool was found in the rectum and in the                            sigmoid colon, precluding visualization. Complications:            No immediate complications. Estimated Blood Loss:     Estimated blood loss: none. Impression:               - Preparation of the colon was poor.                           - Stool in the rectum and in the sigmoid colon.                           - No specimens collected.  Recommendation:           - Patient has a contact number available for                            emergencies. The signs and symptoms of potential                            delayed complications were discussed with the                            patient. Return to normal activities tomorrow.                            Written discharge instructions were provided to the                            patient.                           - Resume previous diet.                           - Repeat colonoscopy tomorrow - use Plenvu prep                           also take 4 dulcolax by mouth about 1 hour before                            starting the Plenvu [name], MD 02/19/2023 2:32:40 PM This report has been signed electronically.

## 2023-02-19 NOTE — Progress Notes (Signed)
To pacu, VSS. Report to [email or web address] 

## 2023-02-20 ENCOUNTER — Encounter: Payer: Self-pay | Admitting: Internal Medicine

## 2023-02-20 ENCOUNTER — Telehealth: Payer: Self-pay

## 2023-02-20 ENCOUNTER — Ambulatory Visit (AMBULATORY_SURGERY_CENTER): Payer: No Typology Code available for payment source | Admitting: Internal Medicine

## 2023-02-20 VITALS — BP 107/69 | HR 97 | Temp 98.6°F | Resp 16 | Ht 64.0 in | Wt 160.0 lb

## 2023-02-20 DIAGNOSIS — Z1211 Encounter for screening for malignant neoplasm of colon: Secondary | ICD-10-CM | POA: Diagnosis present

## 2023-02-20 MED ORDER — SODIUM CHLORIDE 0.9 % IV SOLN
500.0000 mL | Freq: Once | INTRAVENOUS | Status: DC
Start: 2023-02-20 — End: 2023-02-20

## 2023-02-20 NOTE — Telephone Encounter (Signed)
  Follow up Call-     02/19/2023    1:33 PM  Call back number  Post procedure Call Back phone  # [phone]  Permission to leave phone message Yes     Patient is coming back today because she was not cleaned out, activity was fine but has not been able to eat foods at this time.

## 2023-02-20 NOTE — Progress Notes (Signed)
Pt's states no medical or surgical changes since previsit or office visit. 

## 2023-02-20 NOTE — Progress Notes (Signed)
Vss nad trans to pacu 

## 2023-02-20 NOTE — Progress Notes (Signed)
History and Physical Interval Note:  02/20/2023 2:52 PM  Jacqueline Jimenez  has presented today for endoscopic procedure(s), with the diagnosis of  Encounter Diagnosis  Name Primary?   Special screening for malignant neoplasms, colon Yes  .  The various methods of evaluation and treatment have been discussed with the patient and/or family. After consideration of risks, benefits and other options for treatment, the patient has consented to  the endoscopic procedure(s).   The patient's history has been reviewed, patient examined, no change in status, stable for endoscopic procedure(s).  I have reviewed the patient's chart and labs.  Questions were answered to the patient's satisfaction.    She completed additional prep and has returned for colonoscopy [name], MD, [facility]

## 2023-02-20 NOTE — Op Note (Signed)
[facility] Patient Name: Jacqueline Jimenez Procedure Date: 02/20/2023 2:51 PM MRN: [id number] Endoscopist: [name] , MD, [id number] Age: 47 Referring MD:  Date of Birth: 12/09/1975 Gender: Female Account #: [id number] Procedure:                Colonoscopy Indications:              Screening for colorectal malignant neoplasm                            (incomplete exam yesterday after inadequate prep w/                            Suprep - took Plenvu since then) Medicines:                Monitored Anesthesia Care Procedure:                Pre-Anesthesia Assessment:                           - Prior to the procedure, a History and Physical                            was performed, and patient medications and                            allergies were reviewed. The patient's tolerance of                            previous anesthesia was also reviewed. The risks                            and benefits of the procedure and the sedation                            options and risks were discussed with the patient.                            All questions were answered, and informed consent                            was obtained. Prior Anticoagulants: The patient has                            taken no anticoagulant or antiplatelet agents. ASA                            Grade Assessment: II - A patient with mild systemic                            disease. After reviewing the risks and benefits,                            the patient was deemed in satisfactory condition to  undergo the procedure.                           After obtaining informed consent, the colonoscope                            was passed under direct vision. Throughout the                            procedure, the patient's blood pressure, pulse, and                            oxygen saturations were monitored continuously. The                            CF [id number] #[id number] was  introduced through the anus                            and advanced to the the cecum, identified by                            appendiceal orifice and ileocecal valve. The                            colonoscopy was performed without difficulty. The                            patient tolerated the procedure well. The quality                            of the bowel preparation was excellent. The                            ileocecal valve, appendiceal orifice, and rectum                            were photographed. The bowel preparation used was                            SUPREP via extended prep with split dose                            instruction. The bowel preparation used was Plenvu                            via extended prep with split dose instruction. Scope In: 3:01:07 PM Scope Out: 3:11:35 PM Scope Withdrawal Time: 0 hours 5 minutes 44 seconds  Total Procedure Duration: 0 hours 10 minutes 28 seconds  Findings:                 The perianal and digital rectal examinations were                            normal.  The entire examined colon appeared normal on direct                            and retroflexion views. Complications:            No immediate complications. Estimated Blood Loss:     Estimated blood loss: none. Impression:               - The entire examined colon is normal on direct and                            retroflexion views.                           - No specimens collected. Recommendation:           - Patient has a contact number available for                            emergencies. The signs and symptoms of potential                            delayed complications were discussed with the                            patient. Return to normal activities tomorrow.                            Written discharge instructions were provided to the                            patient.                           - Resume previous diet.                            - Continue present medications.                           - Repeat colonoscopy in 10 years for screening                            purposes. Likely needs double prep again [name], MD 02/20/2023 3:21:35 PM This report has been signed electronically.

## 2023-02-20 NOTE — Patient Instructions (Addendum)
I am pleased to report that the colon is normal. No polyps or cancer were seen.  Next routine colonoscopy or other screening test in 10 years - 2034.  I appreciate the opportunity to care for you. [name], MD, FACG  YOU HAD AN ENDOSCOPIC PROCEDURE TODAY AT THE [facility]:   Refer to the procedure report that was given to you for any specific questions about what was found during the examination.  If the procedure report does not answer your questions, please call your gastroenterologist to clarify.  If you requested that your care partner not be given the details of your procedure findings, then the procedure report has been included in a sealed envelope for you to review at your convenience later.  YOU SHOULD EXPECT: Some feelings of bloating in the abdomen. Passage of more gas than usual.  Walking can help get rid of the air that was put into your GI tract during the procedure and reduce the bloating. If you had a lower endoscopy (such as a colonoscopy or flexible sigmoidoscopy) you may notice spotting of blood in your stool or on the toilet paper. If you underwent a bowel prep for your procedure, you may not have a normal bowel movement for a few days.  Please Note:  You might notice some irritation and congestion in your nose or some drainage.  This is from the oxygen used during your procedure.  There is no need for concern and it should clear up in a day or so.  SYMPTOMS TO REPORT IMMEDIATELY:  Following lower endoscopy (colonoscopy or flexible sigmoidoscopy):  Excessive amounts of blood in the stool  Significant tenderness or worsening of abdominal pains  Swelling of the abdomen that is new, acute  Fever of 100F or higher  For urgent or emergent issues, a gastroenterologist can be reached at any hour by calling [phone]. Do not use MyChart messaging for urgent concerns.    DIET:  We do recommend a small meal at first, but then you may proceed to your  regular diet.  Drink plenty of fluids but you should avoid alcoholic beverages for 24 hours.  ACTIVITY:  You should plan to take it easy for the rest of today and you should NOT DRIVE or use heavy machinery until tomorrow (because of the sedation medicines used during the test).    FOLLOW UP: Our staff will call the number listed on your records the next business day following your procedure.  We will call around 7:15- 8:00 am to check on you and address any questions or concerns that you may have regarding the information given to you following your procedure. If we do not reach you, we will leave a message.     If any biopsies were taken you will be contacted by phone or by letter within the next 1-3 weeks.  Please call us at [phone] if you have not heard about the biopsies in 3 weeks.    SIGNATURES/CONFIDENTIALITY: You and/or your care partner have signed paperwork which will be entered into your electronic medical record.  These signatures attest to the fact that that the information above on your After Visit Summary has been reviewed and is understood.  Full responsibility of the confidentiality of this discharge information lies with you and/or your care-partner.

## 2023-02-21 ENCOUNTER — Telehealth: Payer: Self-pay

## 2023-02-21 NOTE — Telephone Encounter (Signed)
Follow up call placed, VM obtained and message left. 

## 2023-03-11 ENCOUNTER — Other Ambulatory Visit: Payer: Self-pay | Admitting: Physician Assistant

## 2023-03-12 NOTE — Telephone Encounter (Signed)
Pt requesting refill for Lorazepam 0.5 mg. Last OV 01/29/2023.

## 2023-03-14 ENCOUNTER — Ambulatory Visit (INDEPENDENT_AMBULATORY_CARE_PROVIDER_SITE_OTHER): Payer: No Typology Code available for payment source | Admitting: Sports Medicine

## 2023-03-14 VITALS — BP 142/94 | HR 71 | Ht 64.0 in | Wt 163.0 lb

## 2023-03-14 DIAGNOSIS — M5441 Lumbago with sciatica, right side: Secondary | ICD-10-CM | POA: Diagnosis not present

## 2023-03-14 DIAGNOSIS — G8929 Other chronic pain: Secondary | ICD-10-CM | POA: Diagnosis not present

## 2023-03-14 DIAGNOSIS — M5126 Other intervertebral disc displacement, lumbar region: Secondary | ICD-10-CM

## 2023-03-14 MED ORDER — METHYLPREDNISOLONE 4 MG PO TBPK: ORAL_TABLET | ORAL | 0 refills | Status: DC

## 2023-03-14 NOTE — Patient Instructions (Addendum)
Referral to neurosurgery: epidural Prednisone called into pharmacy Email work note from todays visit until see by neurosurgery See me as needed

## 2023-03-14 NOTE — Progress Notes (Signed)
Jacqueline Jimenez D.[name] [address] [place] [address] Phone: [phone]   Assessment and Plan:     1. Chronic bilateral low back pain with right-sided sciatica 2. Lumbar herniated disc  -Chronic with exacerbation, subsequent visit - Patient states she was not able to get epidural because her insurance did not cover it.  States that her pain has worsened since her previous office visit.  She says that she took a leave of absence starting earlier this month due to her pain. - Discussed with patient that I would be willing to provide work note starting from her evaluation today until she can be further evaluated by neurosurgery - We will refer to neurosurgery for further discussion of epidural injections that perhaps they can perform and have covered by her insurance versus surgical intervention - Start prednisone Dosepak.  Prescription provided  Pertinent previous records reviewed include none   Follow Up: As needed   Subjective:   I, Jacqueline Jimenez, am serving as a scribe for Dr. Richardean Sale.  Chief Complaint: Recurrent back pain  HPI:  Patient states that she did not get the epidural as they did not take her insurance. Pain is shooting down her R leg when standing, sleeping, sitting. Patient is currently on leave from work since May 6th and would like paperwork filled out for absence.     Relevant Historical Information: None pertinent  Additional pertinent review of systems negative.   Current Outpatient Medications:    albuterol (VENTOLIN HFA) 108 (90 Base) MCG/ACT inhaler, Inhale 2 puffs into the lungs every 4 (four) hours as needed for wheezing or shortness of breath., Disp: 1 each, Rfl: 2   escitalopram (LEXAPRO) 5 MG tablet, Take 1 tablet by mouth once daily, Disp: 30 tablet, Rfl: 2   fluticasone (FLONASE) 50 MCG/ACT nasal spray, Place 1 spray into both nostrils daily., Disp: , Rfl:    ibuprofen (ADVIL,MOTRIN) 200 MG  tablet, Take 400 mg by mouth every 6 (six) hours as needed for moderate pain., Disp: , Rfl:    levocetirizine (XYZAL) 5 MG tablet, Take 1 tablet (5 mg total) by mouth every evening., Disp: 90 tablet, Rfl: 3   LORazepam (ATIVAN) 0.5 MG tablet, TAKE 1 TABLET BY MOUTH UP TO ONCE DAILY AS NEEDED FOR ANXIETY, Disp: 20 tablet, Rfl: 0   methadone (DOLOPHINE) 10 MG/ML solution, Take 120 mg by mouth daily. [facility], Disp: , Rfl:    methylPREDNISolone (MEDROL DOSEPAK) 4 MG TBPK tablet, Instructions per kit, Disp: 21 tablet, Rfl: 0   montelukast (SINGULAIR) 10 MG tablet, Take 1 tablet (10 mg total) by mouth at bedtime., Disp: 90 tablet, Rfl: 3   Objective:     Vitals:   03/14/23 0837  BP: (!) 142/94  Pulse: 71  SpO2: 96%  Weight: 163 lb (73.9 kg)  Height: 5\' 4"  (1.626 m)      Body mass index is 27.98 kg/m.    Physical Exam:    Gen: Appears well, nad, nontoxic and pleasant Psych: Alert and oriented, appropriate mood and affect Neuro: sensation intact, strength is 5/5 in upper and lower extremities, muscle tone wnl Skin: no susupicious lesions or rashes   Back - Normal skin, Spine with normal alignment and no deformity.   No tenderness to vertebral process palpation.   Bilateral lumbar paraspinous muscles are moderately tender and without spasm.  Worse on right compared to left Bilaterally TTP gluteal musculature Straight leg raise positive right, negative  left Trendelenberg positive right, negative left Piriformis Test positive for pain bilaterally    Electronically signed by:  Jacqueline [facility]enez [name] 9:04 AM 03/14/23

## 2023-03-28 ENCOUNTER — Telehealth: Payer: Self-pay | Admitting: Sports Medicine

## 2023-03-28 ENCOUNTER — Telehealth: Payer: Self-pay | Admitting: Physician Assistant

## 2023-03-28 NOTE — Telephone Encounter (Signed)
[facility] called about our referral. They do NOT accept pt insurance.

## 2023-03-28 NOTE — Telephone Encounter (Signed)
[facility] faxed  document coordination of care form , to be filled out by provider. Patient requested to send it via Fax [phone] within 5-days. Document is located in providers tray at front office.Please advise at

## 2023-03-31 NOTE — Telephone Encounter (Signed)
Received papers put them in Jacqueline Jimenez's folder to review and sign.

## 2023-04-02 NOTE — Telephone Encounter (Signed)
[facility] documents signed and faxed.

## 2023-04-23 ENCOUNTER — Other Ambulatory Visit: Payer: Self-pay | Admitting: Physician Assistant

## 2023-04-23 NOTE — Telephone Encounter (Signed)
Pt requesting refill for Lorazepam 0.5 mg. Last OV 01/29/2023. 

## 2023-06-03 ENCOUNTER — Other Ambulatory Visit: Payer: Self-pay | Admitting: Physician Assistant

## 2023-06-03 NOTE — Telephone Encounter (Signed)
Pt requesting refill for Lorazepam 0.5 mg. Last OV 01/29/2023.

## 2023-07-14 ENCOUNTER — Other Ambulatory Visit: Payer: Self-pay | Admitting: Physician Assistant

## 2023-07-15 NOTE — Telephone Encounter (Signed)
Pt requesting refill for Lorazepam 0.5 mg. Last OV 01/29/2023.

## 2023-08-04 ENCOUNTER — Ambulatory Visit: Payer: No Typology Code available for payment source | Admitting: Physician Assistant

## 2023-08-17 ENCOUNTER — Other Ambulatory Visit: Payer: Self-pay | Admitting: Physician Assistant

## 2023-08-19 NOTE — Progress Notes (Incomplete)
Jacqueline Jimenez is a 47 y.o. female here for a follow-up of a pre-existing problem. History of Present Illness:  No chief complaint on file.   HPI Anxiety/Depression: Managed with Lexapro 5 mg daily, and 0.5 mg Ativan as needed. Tolerating well.  *** *** *** *** Denies SI/HI ***  *** *** *** *** *** Past Medical History:  Diagnosis Date   Allergy    seasonal   Anxiety    Arthritis    Blood type, Rh negative    Depression    History of chicken pox    PID (pelvic inflammatory disease)    require hospitalizaiton at age 86   Pyloric stenosis    as an infant   Substance abuse (HCC)     Social History   Tobacco Use   Smoking status: Former    Current packs/day: 0.00    Average packs/day: 0.3 packs/day for 11.0 years (2.8 ttl pk-yrs)    Types: Cigarettes    Start date: 58    Quit date: 2006    Years since quitting: 18.8   Smokeless tobacco: Never  Vaping Use   Vaping status: Never Used  Substance Use Topics   Alcohol use: Not Currently    Comment: 2001   Drug use: Not Currently    Types: Other-see comments    Comment: Opiates, on methadone   Past Surgical History:  Procedure Laterality Date   APPENDECTOMY     calf augmentation     CESAREAN SECTION     club foot correction     DILATION AND CURETTAGE OF UTERUS     HERNIA REPAIR     RHINOPLASTY     Family History  Problem Relation Age of Onset   Asthma Mother    Breast cancer Mother 22       survivor   Hypertension Father    Diabetes Maternal Grandmother    Breast cancer Maternal Grandmother 80   Breast cancer Other        unknown age and pased away   Colon polyps Neg Hx    Colon cancer Neg Hx    Esophageal cancer Neg Hx    Rectal cancer Neg Hx    Stomach cancer Neg Hx    No Known Allergies Current Medications:   Current Outpatient Medications:    albuterol (VENTOLIN HFA) 108 (90 Base) MCG/ACT inhaler, Inhale 2 puffs into the lungs every 4 (four) hours as needed for wheezing or shortness of  breath., Disp: 1 each, Rfl: 2   escitalopram (LEXAPRO) 5 MG tablet, Take 1 tablet by mouth once daily, Disp: 90 tablet, Rfl: 0   fluticasone (FLONASE) 50 MCG/ACT nasal spray, Place 1 spray into both nostrils daily., Disp: , Rfl:    ibuprofen (ADVIL,MOTRIN) 200 MG tablet, Take 400 mg by mouth every 6 (six) hours as needed for moderate pain., Disp: , Rfl:    levocetirizine (XYZAL) 5 MG tablet, Take 1 tablet (5 mg total) by mouth every evening., Disp: 90 tablet, Rfl: 3   LORazepam (ATIVAN) 0.5 MG tablet, TAKE 1 TABLET BY MOUTH AS NEEDED UP TO ONCE DAILY FOR ANXIETY, Disp: 20 tablet, Rfl: 0   methadone (DOLOPHINE) 10 MG/ML solution, Take 120 mg by mouth daily. [facility], Disp: , Rfl:    methylPREDNISolone (MEDROL DOSEPAK) 4 MG TBPK tablet, Instructions per kit, Disp: 21 tablet, Rfl: 0   montelukast (SINGULAIR) 10 MG tablet, Take 1 tablet (10 mg total) by mouth at bedtime., Disp: 90 tablet, Rfl: 3  Review of Systems:   ROS See pertinent positives and negatives as per the HPI.  Vitals:   There were no vitals filed for this visit.   There is no height or weight on file to calculate BMI.  Physical Exam:   Physical Exam  Assessment and Plan:   There are no diagnoses linked to this encounter.            I,Emily Lagle,acting as a Neurosurgeon for [employer], PA.,have documented all relevant documentation on the behalf of Jarold Motto, PA,as directed by  Jarold Motto, PA while in the presence of Jarold Motto, [place].  *** (refresh reminder)  I, Jarold Motto, PA, have reviewed all documentation for this visit. The documentation on 08/19/23 for the exam, diagnosis, procedures, and orders are all accurate and complete.  Jarold Motto, PA-C

## 2023-08-19 NOTE — Telephone Encounter (Signed)
Pt requesting refill for Ativan 0.5 mg. Last OV 01/29/2023.

## 2023-08-21 ENCOUNTER — Ambulatory Visit: Payer: No Typology Code available for payment source | Admitting: Physician Assistant

## 2023-08-28 NOTE — Progress Notes (Signed)
Jacqueline Jimenez is a 47 y.o. female here for a follow-up of a pre-existing problem.  History of Present Illness:   Chief Complaint  Patient presents with   Anxiety/Depression   HPI - ambulating w/ rolator  Anxiety/Depression: Managed with 5 mg Lexapro daily and 0.5 mg Ativan as needed (reports taking couple days/week) Reports difficulty sleeping lately due to lower back pain otherwise her moods are stable.  Denies suicidal ideation/hi  Chronic LBP (Bilateral) w/ R-sided Sciatica: Managed with Methylprednisolone; also uses Lidocaine patches and CBD topical cream.  Reports that she will be receiving an epidural injection as nothing else has been improving her pain.  Irregular Periods: Reports that she is now having irregular periods and when she is menstruating her bleeding is heavy - this has been occurring since having her Mirena IUD removed. Denies lightheadedness/dizziness She does not track her periods  Liver Function: Would like to have her liver function checked today.   Past Medical History:  Diagnosis Date   Allergy    seasonal   Anxiety    Arthritis    Blood type, Rh negative    Depression    History of chicken pox    PID (pelvic inflammatory disease)    require hospitalizaiton at age 39   Pyloric stenosis    as an infant   Substance abuse (HCC)     Social History   Tobacco Use   Smoking status: Former    Current packs/day: 0.00    Average packs/day: 0.3 packs/day for 11.0 years (2.8 ttl pk-yrs)    Types: Cigarettes    Start date: 26    Quit date: 2006    Years since quitting: 18.8   Smokeless tobacco: Never  Vaping Use   Vaping status: Never Used  Substance Use Topics   Alcohol use: Not Currently    Comment: 2001   Drug use: Not Currently    Types: Other-see comments    Comment: Opiates, on methadone   Past Surgical History:  Procedure Laterality Date   APPENDECTOMY     calf augmentation     CESAREAN SECTION     club foot correction      DILATION AND CURETTAGE OF UTERUS     HERNIA REPAIR     RHINOPLASTY     Family History  Problem Relation Age of Onset   Asthma Mother    Breast cancer Mother 40       survivor   Hypertension Father    Diabetes Maternal Grandmother    Breast cancer Maternal Grandmother 18   Breast cancer Other        unknown age and pased away   Colon polyps Neg Hx    Colon cancer Neg Hx    Esophageal cancer Neg Hx    Rectal cancer Neg Hx    Stomach cancer Neg Hx    No Known Allergies Current Medications:   Current Outpatient Medications:    albuterol (VENTOLIN HFA) 108 (90 Base) MCG/ACT inhaler, Inhale 2 puffs into the lungs every 4 (four) hours as needed for wheezing or shortness of breath., Disp: 1 each, Rfl: 2   escitalopram (LEXAPRO) 5 MG tablet, Take 1 tablet by mouth once daily, Disp: 90 tablet, Rfl: 0   fluticasone (FLONASE) 50 MCG/ACT nasal spray, Place 1 spray into both nostrils daily., Disp: , Rfl:    ibuprofen (ADVIL,MOTRIN) 200 MG tablet, Take 400 mg by mouth every 6 (six) hours as needed for moderate pain., Disp: , Rfl:  levocetirizine (XYZAL) 5 MG tablet, Take 1 tablet (5 mg total) by mouth every evening., Disp: 90 tablet, Rfl: 3   methadone (DOLOPHINE) 10 MG/ML solution, Take 120 mg by mouth daily. [facility], Disp: , Rfl:    montelukast (SINGULAIR) 10 MG tablet, Take 1 tablet (10 mg total) by mouth at bedtime., Disp: 90 tablet, Rfl: 3   LORazepam (ATIVAN) 0.5 MG tablet, TAKE 1 TABLET BY MOUTH AS NEEDED UP TO ONCE DAILY FOR ANXIETY, Disp: 20 tablet, Rfl: 0  Review of Systems:   ROS See pertinent positives and negatives as per the HPI.  Vitals:   Vitals:   08/29/23 1310  BP: 120/80  Pulse: 68  Temp: (!) 97.3 F (36.3 C)  TempSrc: Temporal  SpO2: 95%  Weight: 164 lb 4 oz (74.5 kg)  Height: 5\' 4"  (1.626 m)     Body mass index is 28.19 kg/m.  Physical Exam:   Physical Exam Vitals and nursing note reviewed.  Constitutional:      General: She is not  in acute distress.    Appearance: She is well-developed. She is not ill-appearing or toxic-appearing.  Cardiovascular:     Rate and Rhythm: Normal rate and regular rhythm.     Pulses: Normal pulses.     Heart sounds: Normal heart sounds, S1 normal and S2 normal.  Pulmonary:     Effort: Pulmonary effort is normal.     Breath sounds: Normal breath sounds.  Skin:    General: Skin is warm and dry.  Neurological:     Mental Status: She is alert.     GCS: GCS eye subscore is 4. GCS verbal subscore is 5. GCS motor subscore is 6.  Psychiatric:        Speech: Speech normal.        Behavior: Behavior normal. Behavior is cooperative.     Assessment and Plan:   Hepatic steatosis Update liver labs and provide recommendations accordingly Continue healthy lifestyle  Encounter for lipid screening for cardiovascular disease Update lipid panel  Abnormal menstrual periods Will check [facility] Recommend regular tracking of periods  Need for immunization against influenza Updated today  Anxious depression Well controlled Continue Lexapro 5 mg daily and 0.5 mg ativan as needed for anxiety Prescription drug monitoring program reviewed - no red flags Follow-up in 6 month(s), sooner if concerns  Chronic midline low back pain with right-sided sciatica Management per neurosurgery and sports medicine   I,Emily Lagle,acting as a scribe for [employer], PA.,have documented all relevant documentation on the behalf of Jarold Motto, PA,as directed by  Jarold Motto, PA while in the presence of Jarold Motto, [place].  I, Jarold Motto, [place], have reviewed all documentation for this visit. The documentation on 08/29/23 for the exam, diagnosis, procedures, and orders are all accurate and complete.  Jarold Motto, PA-C

## 2023-08-29 ENCOUNTER — Encounter: Payer: Self-pay | Admitting: Physician Assistant

## 2023-08-29 ENCOUNTER — Ambulatory Visit: Payer: PRIVATE HEALTH INSURANCE | Admitting: Physician Assistant

## 2023-08-29 VITALS — BP 120/80 | HR 68 | Temp 97.3°F | Ht 64.0 in | Wt 164.2 lb

## 2023-08-29 DIAGNOSIS — Z13220 Encounter for screening for lipoid disorders: Secondary | ICD-10-CM | POA: Diagnosis not present

## 2023-08-29 DIAGNOSIS — Z23 Encounter for immunization: Secondary | ICD-10-CM | POA: Diagnosis not present

## 2023-08-29 DIAGNOSIS — G8929 Other chronic pain: Secondary | ICD-10-CM

## 2023-08-29 DIAGNOSIS — Z136 Encounter for screening for cardiovascular disorders: Secondary | ICD-10-CM | POA: Diagnosis not present

## 2023-08-29 DIAGNOSIS — K760 Fatty (change of) liver, not elsewhere classified: Secondary | ICD-10-CM

## 2023-08-29 DIAGNOSIS — F418 Other specified anxiety disorders: Secondary | ICD-10-CM

## 2023-08-29 DIAGNOSIS — N926 Irregular menstruation, unspecified: Secondary | ICD-10-CM

## 2023-08-29 DIAGNOSIS — M5441 Lumbago with sciatica, right side: Secondary | ICD-10-CM

## 2023-08-29 LAB — CBC WITH DIFFERENTIAL/PLATELET
Basophils Absolute: 0 10*3/uL (ref 0.0–0.1)
Basophils Relative: 0.8 % (ref 0.0–3.0)
Eosinophils Absolute: 0.1 10*3/uL (ref 0.0–0.7)
Eosinophils Relative: 2.7 % (ref 0.0–5.0)
HCT: 37.1 % (ref 36.0–46.0)
Hemoglobin: 12.2 g/dL (ref 12.0–15.0)
Lymphocytes Relative: 39.9 % (ref 12.0–46.0)
Lymphs Abs: 2.2 10*3/uL (ref 0.7–4.0)
MCHC: 32.9 g/dL (ref 30.0–36.0)
MCV: 94.9 fL (ref 78.0–100.0)
Monocytes Absolute: 0.4 10*3/uL (ref 0.1–1.0)
Monocytes Relative: 8.1 % (ref 3.0–12.0)
Neutro Abs: 2.7 10*3/uL (ref 1.4–7.7)
Neutrophils Relative %: 48.5 % (ref 43.0–77.0)
Platelets: 285 10*3/uL (ref 150.0–400.0)
RBC: 3.91 Mil/uL (ref 3.87–5.11)
RDW: 14.9 % (ref 11.5–15.5)
WBC: 5.6 10*3/uL (ref 4.0–10.5)

## 2023-08-29 LAB — LIPID PANEL
Cholesterol: 235 mg/dL — ABNORMAL HIGH (ref 0–200)
HDL: 52.2 mg/dL (ref >39.00)
LDL Cholesterol: 149 mg/dL — ABNORMAL HIGH (ref 0–99)
NonHDL: 182.62
Total CHOL/HDL Ratio: 4
Triglycerides: 170 mg/dL — ABNORMAL HIGH (ref 0.0–149.0)
VLDL: 34 mg/dL (ref 0.0–40.0)

## 2023-08-29 LAB — COMPREHENSIVE METABOLIC PANEL
ALT: 30 U/L (ref 0–35)
AST: 43 U/L — ABNORMAL HIGH (ref 0–37)
Albumin: 4.1 g/dL (ref 3.5–5.2)
Alkaline Phosphatase: 77 U/L (ref 39–117)
BUN: 6 mg/dL (ref 6–23)
CO2: 31 meq/L (ref 19–32)
Calcium: 9.1 mg/dL (ref 8.4–10.5)
Chloride: 102 meq/L (ref 96–112)
Creatinine, Ser: 0.77 mg/dL (ref 0.40–1.20)
GFR: 92.65 mL/min (ref >60.00)
Glucose, Bld: 108 mg/dL — ABNORMAL HIGH (ref 70–99)
Potassium: 4 meq/L (ref 3.5–5.1)
Sodium: 141 meq/L (ref 135–145)
Total Bilirubin: 0.3 mg/dL (ref 0.2–1.2)
Total Protein: 6.8 g/dL (ref 6.0–8.3)

## 2023-08-29 LAB — FOLLICLE STIMULATING HORMONE: FSH: 18.4 m[IU]/mL

## 2023-08-29 MED ORDER — LORAZEPAM 0.5 MG PO TABS: ORAL_TABLET | ORAL | 0 refills | Status: DC

## 2023-08-29 NOTE — Patient Instructions (Signed)
It was great to see you!  Please track your periods for [place]  Follow-up in 6 months, sooner if concerns  Take care,  Jarold Motto PA-C

## 2023-09-01 ENCOUNTER — Other Ambulatory Visit: Payer: Self-pay | Admitting: Physician Assistant

## 2023-09-01 DIAGNOSIS — K760 Fatty (change of) liver, not elsewhere classified: Secondary | ICD-10-CM

## 2023-09-02 ENCOUNTER — Other Ambulatory Visit (HOSPITAL_BASED_OUTPATIENT_CLINIC_OR_DEPARTMENT_OTHER): Payer: Self-pay | Admitting: Physician Assistant

## 2023-09-02 DIAGNOSIS — Z1231 Encounter for screening mammogram for malignant neoplasm of breast: Secondary | ICD-10-CM

## 2023-09-10 ENCOUNTER — Encounter (HOSPITAL_BASED_OUTPATIENT_CLINIC_OR_DEPARTMENT_OTHER): Payer: Self-pay | Admitting: Radiology

## 2023-09-10 ENCOUNTER — Ambulatory Visit (HOSPITAL_BASED_OUTPATIENT_CLINIC_OR_DEPARTMENT_OTHER)
Admission: RE | Admit: 2023-09-10 | Discharge: 2023-09-10 | Disposition: A | Discharge status: Home / Self Care | Payer: PRIVATE HEALTH INSURANCE | Source: Ambulatory Visit | Attending: Physician Assistant | Admitting: Physician Assistant

## 2023-09-10 DIAGNOSIS — Z1231 Encounter for screening mammogram for malignant neoplasm of breast: Secondary | ICD-10-CM | POA: Insufficient documentation

## 2023-09-30 ENCOUNTER — Other Ambulatory Visit: Payer: Self-pay | Admitting: Physician Assistant

## 2023-09-30 NOTE — Telephone Encounter (Signed)
Pt requesting refill for Lorazepam 0.5 mg. Last OV 08/29/2023.

## 2023-10-07 ENCOUNTER — Ambulatory Visit: Payer: PRIVATE HEALTH INSURANCE | Admitting: Physician Assistant

## 2023-10-09 ENCOUNTER — Encounter: Payer: Self-pay | Admitting: Physician Assistant

## 2023-10-10 ENCOUNTER — Other Ambulatory Visit: Payer: Self-pay | Admitting: Physician Assistant

## 2023-10-10 DIAGNOSIS — R748 Abnormal levels of other serum enzymes: Secondary | ICD-10-CM

## 2023-10-24 ENCOUNTER — Telehealth: Payer: Self-pay | Admitting: Physician Assistant

## 2023-10-24 NOTE — Telephone Encounter (Signed)
 [facility] faxed document  Coordination of care for prescription medication , to be filled out by provider. Patient requested to send it back via Fax within 5-days. Document is located in providers tray at front office.Please advise at  [phone].

## 2023-10-29 ENCOUNTER — Other Ambulatory Visit: Payer: Self-pay | Admitting: Physician Assistant

## 2023-10-29 NOTE — Telephone Encounter (Signed)
 Pt requesting refill for Lorazepam 0.5 mg tablet. Last OV 08/29/2023.

## 2023-10-29 NOTE — Telephone Encounter (Signed)
 Documents signed and faxed back to [facility].

## 2023-11-05 DIAGNOSIS — M5416 Radiculopathy, lumbar region: Secondary | ICD-10-CM | POA: Diagnosis not present

## 2023-12-01 ENCOUNTER — Other Ambulatory Visit: Payer: Self-pay | Admitting: Physician Assistant

## 2023-12-01 NOTE — Telephone Encounter (Signed)
 Copied from CRM [id number]. Topic: Clinical - Medication Refill >> Dec 01, 2023  4:16 PM [place] J wrote: Most Recent Primary Care Visit:  Provider: WORLEY, SAMANTHA  Department: [facility]  Visit Type: OFFICE VISIT  Date: 08/29/2023  Medication:  escitalopram  (LEXAPRO ) 5 MG tablet LORazepam  (ATIVAN ) 0.5 MG tablet  Has the patient contacted their pharmacy? No (Agent: If no, request that the patient contact the pharmacy for the refill. If patient does not wish to contact the pharmacy document the reason why and proceed with request.) (Agent: If yes, when and what did the pharmacy advise?)  Is this the correct pharmacy for this prescription? No If no, delete pharmacy and type the correct one.  This is the patient's preferred pharmacy:   [facility] [address] [place] [address] Phone: [phone]  Has the prescription been filled recently? No  Is the patient out of the medication? Yes  Has the patient been seen for an appointment in the last year OR does the patient have an upcoming appointment? Yes  Can we respond through MyChart? Yes  Agent: Please be advised that Rx refills may take up to 3 business days. We ask that you follow-up with your pharmacy.

## 2023-12-01 NOTE — Telephone Encounter (Signed)
 Last Fill: Lexapro : 08/19/23     Ativan : 10/29/23  Last OV: 08/29/23 Next OV: 02/27/24  Routing to provider for review/authorization.

## 2023-12-02 MED ORDER — LORAZEPAM 0.5 MG PO TABS: ORAL_TABLET | ORAL | 0 refills | Status: DC

## 2023-12-02 MED ORDER — ESCITALOPRAM OXALATE 5 MG PO TABS: 5.0000 mg | ORAL_TABLET | Freq: Every day | ORAL | 0 refills | Status: DC

## 2023-12-02 NOTE — Telephone Encounter (Signed)
Pt requesting refill for Lorazepam 0.5 mg tablet. Last OV 08/29/2023.

## 2023-12-18 DIAGNOSIS — M5416 Radiculopathy, lumbar region: Secondary | ICD-10-CM | POA: Diagnosis not present

## 2023-12-30 ENCOUNTER — Other Ambulatory Visit: Payer: Self-pay | Admitting: Physician Assistant

## 2023-12-30 NOTE — Telephone Encounter (Signed)
 Pt requesting refill for Lorazepam 0.5 mg tablet. Last OV 08/29/2023. Pt scheduled in May for an appt.

## 2024-01-15 ENCOUNTER — Ambulatory Visit: Payer: No Typology Code available for payment source | Admitting: Dermatology

## 2024-01-30 ENCOUNTER — Other Ambulatory Visit: Payer: Self-pay | Admitting: Physician Assistant

## 2024-02-02 NOTE — Telephone Encounter (Signed)
 Pt requesting refill for Lorazepam 0.5 mg. Last OV 08/29/2023. Pt scheduled 02/27/2024.

## 2024-02-17 NOTE — Progress Notes (Unsigned)
    Jacqueline Jimenez D.[facility] [address] [place] [address] Phone: [phone]   Assessment and Plan:     There are no diagnoses linked to this encounter.  ***   Pertinent previous records reviewed include ***    Follow Up: ***     Subjective:   I, Jacqueline Jimenez, am serving as a Neurosurgeon for Doctor [employer]  Chief Complaint: Recurrent back pain   HPI:  Patient states that she did not get the epidural as they did not take her insurance. Pain is shooting down her R leg when standing, sleeping, sitting. Patient is currently on leave from work since May 6th and would like paperwork filled out for absence.   02/18/2024 Patient states    Relevant Historical Information: None pertinent    Additional pertinent review of systems negative.   Current Outpatient Medications:    albuterol  (VENTOLIN  HFA) 108 (90 Base) MCG/ACT inhaler, Inhale 2 puffs into the lungs every 4 (four) hours as needed for wheezing or shortness of breath., Disp: 1 each, Rfl: 2   escitalopram  (LEXAPRO ) 5 MG tablet, Take 1 tablet (5 mg total) by mouth daily., Disp: 90 tablet, Rfl: 0   fluticasone (FLONASE) 50 MCG/ACT nasal spray, Place 1 spray into both nostrils daily., Disp: , Rfl:    ibuprofen  (ADVIL ,MOTRIN ) 200 MG tablet, Take 400 mg by mouth every 6 (six) hours as needed for moderate pain., Disp: , Rfl:    levocetirizine (XYZAL ) 5 MG tablet, Take 1 tablet (5 mg total) by mouth every evening., Disp: 90 tablet, Rfl: 3   LORazepam  (ATIVAN ) 0.5 MG tablet, TAKE 1 TABLET BY MOUTH ONCE DAILY AS NEEDED FOR ANXIETY, Disp: 20 tablet, Rfl: 0   methadone (DOLOPHINE) 10 MG/ML solution, Take 120 mg by mouth daily. [facility], Disp: , Rfl:    montelukast  (SINGULAIR ) 10 MG tablet, Take 1 tablet (10 mg total) by mouth at bedtime., Disp: 90 tablet, Rfl: 3   Objective:     There were no vitals filed for this visit.    There is no height or weight on file to  calculate BMI.    Physical Exam:    ***   Electronically signed by:  Jacqueline Jimenez D.[facility] 7:45 AM 02/17/24

## 2024-02-18 ENCOUNTER — Ambulatory Visit: Payer: MEDICAID | Admitting: Sports Medicine

## 2024-02-18 VITALS — BP 126/80 | HR 102 | Ht 64.0 in | Wt 164.0 lb

## 2024-02-18 DIAGNOSIS — M5126 Other intervertebral disc displacement, lumbar region: Secondary | ICD-10-CM | POA: Diagnosis not present

## 2024-02-18 DIAGNOSIS — M5441 Lumbago with sciatica, right side: Secondary | ICD-10-CM

## 2024-02-18 DIAGNOSIS — M25512 Pain in left shoulder: Secondary | ICD-10-CM

## 2024-02-18 DIAGNOSIS — G8929 Other chronic pain: Secondary | ICD-10-CM

## 2024-02-18 NOTE — Patient Instructions (Addendum)
 Shoulder Hep. Follow up with Neurosurgery. Follow up with us  in 3 to 4 weeks.

## 2024-02-20 ENCOUNTER — Telehealth: Payer: Self-pay | Admitting: Sports Medicine

## 2024-02-20 DIAGNOSIS — G8929 Other chronic pain: Secondary | ICD-10-CM

## 2024-02-20 DIAGNOSIS — M9903 Segmental and somatic dysfunction of lumbar region: Secondary | ICD-10-CM

## 2024-02-20 DIAGNOSIS — M5126 Other intervertebral disc displacement, lumbar region: Secondary | ICD-10-CM

## 2024-02-20 NOTE — Telephone Encounter (Signed)
 Per pt, Dr. Cleora Daft informed her to contact [facility] for surgery consult. Pt is established with Pain Medicine there and thought she could just call them, but she has not had any luck.  Can we send a surgery referral to them for her, to see if that helps her get scheduled?

## 2024-02-23 ENCOUNTER — Ambulatory Visit: Admitting: Dermatology

## 2024-02-23 NOTE — Telephone Encounter (Signed)
 Should not HAVE to refer as she is established, although they somewhat separate neurosurgery from pain management. I told pt this, but she thought it might help her get in more quickly.

## 2024-02-27 ENCOUNTER — Ambulatory Visit (INDEPENDENT_AMBULATORY_CARE_PROVIDER_SITE_OTHER): Payer: MEDICAID | Admitting: Physician Assistant

## 2024-02-27 ENCOUNTER — Encounter: Payer: Self-pay | Admitting: Physician Assistant

## 2024-02-27 VITALS — BP 128/76 | HR 76 | Temp 97.3°F | Wt 157.2 lb

## 2024-02-27 DIAGNOSIS — N920 Excessive and frequent menstruation with regular cycle: Secondary | ICD-10-CM

## 2024-02-27 DIAGNOSIS — M5441 Lumbago with sciatica, right side: Secondary | ICD-10-CM | POA: Diagnosis not present

## 2024-02-27 DIAGNOSIS — F418 Other specified anxiety disorders: Secondary | ICD-10-CM | POA: Diagnosis not present

## 2024-02-27 DIAGNOSIS — G8929 Other chronic pain: Secondary | ICD-10-CM | POA: Diagnosis not present

## 2024-02-27 MED ORDER — LORAZEPAM 0.5 MG PO TABS
0.5000 mg | ORAL_TABLET | Freq: Every day | ORAL | 0 refills | Status: AC | PRN
Start: 2024-02-27 — End: ?

## 2024-02-27 MED ORDER — ESCITALOPRAM OXALATE 5 MG PO TABS: 5.0000 mg | ORAL_TABLET | Freq: Every day | ORAL | 0 refills | Status: DC

## 2024-02-27 NOTE — Progress Notes (Signed)
 Jacqueline Jimenez is a 48 y.o. female here for a follow up of a pre-existing problem.  History of Present Illness:   Chief Complaint  Patient presents with   Medical Management of Chronic Issues    HPI  Back Pain Patient states that she is getting epidural CSI every 3 months which typically provides 6 weeks of relief but would experience flare ups after then.  Pain is 9/10 during a flare up. She plans to discuss potential surgical intervention with neurosurgery.    Anxiety/Depression  Patient reports compliance and good tolerance of Ativan  0.5 mg and Lexapro  5 mg daily. She is requesting refills today.  Patient states that she typically goes through both of them in the month.  She often feels very anxious in the morning and has trouble getting out of bed.   Menstrual Cycles  Patient states that her menstrual cycles are currently heavy but attributes that to not being on any birth control. Cycles are lighter when being on birth controls.  She is currently not on any birth control due to her age.  Cycles are currently regular.   Past Medical History:  Diagnosis Date   Allergy    seasonal   Anxiety    Arthritis    Blood type, Rh negative    Depression    History of chicken pox    PID (pelvic inflammatory disease)    require hospitalizaiton at age 76   Pyloric stenosis    as an infant   Substance abuse (HCC)      Social History   Tobacco Use   Smoking status: Former    Current packs/day: 0.00    Average packs/day: 0.3 packs/day for 11.0 years (2.8 ttl pk-yrs)    Types: Cigarettes    Start date: 37    Quit date: 2006    Years since quitting: 19.3   Smokeless tobacco: Never  Vaping Use   Vaping status: Never Used  Substance Use Topics   Alcohol use: Not Currently    Comment: 2001   Drug use: Not Currently    Types: Other-see comments    Comment: Opiates, on methadone    Past Surgical History:  Procedure Laterality Date   APPENDECTOMY     calf augmentation      CESAREAN SECTION     club foot correction     DILATION AND CURETTAGE OF UTERUS     HERNIA REPAIR     RHINOPLASTY      Family History  Problem Relation Age of Onset   Asthma Mother    Breast cancer Mother 31       survivor   Hypertension Father    Diabetes Maternal Grandmother    Breast cancer Maternal Grandmother 61   Breast cancer Other        unknown age and pased away   Colon polyps Neg Hx    Colon cancer Neg Hx    Esophageal cancer Neg Hx    Rectal cancer Neg Hx    Stomach cancer Neg Hx     No Known Allergies  Current Medications:   Current Outpatient Medications:    albuterol  (VENTOLIN  HFA) 108 (90 Base) MCG/ACT inhaler, Inhale 2 puffs into the lungs every 4 (four) hours as needed for wheezing or shortness of breath., Disp: 1 each, Rfl: 2   fluticasone (FLONASE) 50 MCG/ACT nasal spray, Place 1 spray into both nostrils daily., Disp: , Rfl:    ibuprofen  (ADVIL ,MOTRIN ) 200 MG tablet, Take 400 mg by  mouth every 6 (six) hours as needed for moderate pain., Disp: , Rfl:    levocetirizine (XYZAL ) 5 MG tablet, Take 1 tablet (5 mg total) by mouth every evening., Disp: 90 tablet, Rfl: 3   methadone (DOLOPHINE) 10 MG/ML solution, Take 120 mg by mouth daily. [facility], Disp: , Rfl:    montelukast  (SINGULAIR ) 10 MG tablet, Take 1 tablet (10 mg total) by mouth at bedtime., Disp: 90 tablet, Rfl: 3   escitalopram  (LEXAPRO ) 5 MG tablet, Take 1 tablet (5 mg total) by mouth daily., Disp: 90 tablet, Rfl: 0   LORazepam  (ATIVAN ) 0.5 MG tablet, Take 1 tablet (0.5 mg total) by mouth daily as needed for anxiety., Disp: 20 tablet, Rfl: 0   Review of Systems:   ROS Negative unless otherwise specified per HPI.  Vitals:   Vitals:   02/27/24 1414  BP: 128/76  Pulse: 76  Temp: (!) 97.3 F (36.3 C)  SpO2: 95%  Weight: 157 lb 3.2 oz (71.3 kg)     Body mass index is 26.98 kg/m.  Physical Exam:   Physical Exam Vitals and nursing note reviewed.  Constitutional:       General: She is not in acute distress.    Appearance: She is well-developed. She is not ill-appearing or toxic-appearing.  Cardiovascular:     Rate and Rhythm: Normal rate and regular rhythm.     Pulses: Normal pulses.     Heart sounds: Normal heart sounds, S1 normal and S2 normal.  Pulmonary:     Effort: Pulmonary effort is normal.     Breath sounds: Normal breath sounds.  Skin:    General: Skin is warm and dry.  Neurological:     Mental Status: She is alert.     GCS: GCS eye subscore is 4. GCS verbal subscore is 5. GCS motor subscore is 6.  Psychiatric:        Speech: Speech normal.        Behavior: Behavior normal. Behavior is cooperative.     Assessment and Plan:   Chronic midline low back pain with right-sided sciatica Ongoing Management per sports medicine and neurosurgery No red flag symptom(s)   Anxious depression Uncontrolled We discussed increasing her Lexapro  -- she is in agreement however she does not have time for EKG today -- we will plan to follow up in 1 month with her Comprehensive Physical Exam (CPE) preventive care annual visit and address this at that time Reviewed her ativan  use -- discussed recommend to reduce frequency and that we will trial buspar at next visit - she is in agreement I discussed with patient that if they develop any SI, to tell someone immediately and seek medical attention.  Menorrhagia with regular cycle Ongoing Denies concerns for pregnancy Recommend consistent condom use with sex as she is still able to get pregnant Will update iron panel at next visit to assess for iron deficiency anemia   Alexander Iba, PA-C   Scot Cutter M Kadhim,acting as a scribe for Alexander Iba, PA.,have documented all relevant documentation on the behalf of Alexander Iba, PA,as directed by  Alexander Iba, PA while in the presence of Alexander Iba, [place].   I, Alexander Iba, [place], have reviewed all documentation for this visit. The documentation on  02/27/24 for the exam, diagnosis, procedures, and orders are all accurate and complete.   I spent a total of 30 minutes on this visit, today 02/27/24, which included reviewing previous notes from sports medicine, ordering tests, discussing plan of  care with patient and using shared-decision making on next steps, refilling medications, and documenting the findings in the note.

## 2024-02-27 NOTE — Patient Instructions (Signed)
 It was great to see you!  TENS UNIT: This is helpful for muscle pain and spasm.   Search and Purchase a TENS 7000 2nd edition at  www.tenspros.com or www.Amazon.com It should be less than $30.     TENS unit instructions: Do not shower or bathe with the unit on Turn the unit off before removing electrodes or batteries If the electrodes lose stickiness add a drop of water to the electrodes after they are disconnected from the unit and place on plastic sheet. If you continued to have difficulty, call the TENS unit company to purchase more electrodes. Do not apply lotion on the skin area prior to use. Make sure the skin is clean and dry as this will help prolong the life of the electrodes. After use, always check skin for unusual red areas, rash or other skin difficulties. If there are any skin problems, does not apply electrodes to the same area. Never remove the electrodes from the unit by pulling the wires. Do not use the TENS unit or electrodes other than as directed. Do not change electrode placement without consultating your therapist or physician. Keep 2 fingers with between each electrode. Wear time ratio is 2:1, on to off times.    For example on for 30 minutes off for 15 minutes and then on for 30 minutes off for 15 minutes   Follow up in 1 month  Take care,  Alexander Iba PA-C

## 2024-03-09 NOTE — Progress Notes (Signed)
 Ben Corynne Scibilia D.[facility] [address] [place] [address] Phone: [phone]   Assessment and Plan:     1. Chronic bilateral low back pain with right-sided sciatica 2. Lumbar herniated disc -Chronic with exacerbation, subsequent visit - Continued low back pain with radicular symptoms.  Patient has been having decreased relief for shorter duration with epidural CSI only lasting about 6 weeks. - Patient is following with neurosurgery who plans on repeating lumbar MRI to see if patient would be surgical candidate.  Recommend continuing follow-up with neurosurgery for continued treatments  3. Acute pain of left shoulder  -Acute, subsequent visit - Mild improvement in left shoulder pain after subacromial CSI previous office visit on 02/18/24.  Patient still has episodes of pain likely related to positioning - Recommend targeting ergonomic changes to decrease strain over shoulder day-to-day - Continue HEP  Pertinent previous records reviewed include none  Follow Up: As needed   Subjective:   I, Jacqueline Jimenez am a scribe for Dr. Cleora Daft.    Chief Complaint: Recurrent back pain   HPI:  Patient states that she did not get the epidural as they did not take her insurance. Pain is shooting down her R leg when standing, sleeping, sitting. Patient is currently on leave from work since May 6th and would like paperwork filled out for absence.    02/18/2024 Patient states back pain is pretty bad today 10/10 pain. Sciatic is messing up today as well down her whole leg. Has been going on since yesterday,    03/10/2024 Patient states that she is doing ok. Saw Dr. Andy Bannister and he ordered a new MRI and will proceed with surgical options are available. Going back to get another back injection next week.    Relevant Historical Information: None pertinent    Additional pertinent review of systems negative.   Current Outpatient Medications:    albuterol   (VENTOLIN  HFA) 108 (90 Base) MCG/ACT inhaler, Inhale 2 puffs into the lungs every 4 (four) hours as needed for wheezing or shortness of breath., Disp: 1 each, Rfl: 2   escitalopram  (LEXAPRO ) 5 MG tablet, Take 1 tablet (5 mg total) by mouth daily., Disp: 90 tablet, Rfl: 0   fluticasone (FLONASE) 50 MCG/ACT nasal spray, Place 1 spray into both nostrils daily., Disp: , Rfl:    ibuprofen  (ADVIL ,MOTRIN ) 200 MG tablet, Take 400 mg by mouth every 6 (six) hours as needed for moderate pain., Disp: , Rfl:    levocetirizine (XYZAL ) 5 MG tablet, Take 1 tablet (5 mg total) by mouth every evening., Disp: 90 tablet, Rfl: 3   LORazepam  (ATIVAN ) 0.5 MG tablet, Take 1 tablet (0.5 mg total) by mouth daily as needed for anxiety., Disp: 20 tablet, Rfl: 0   methadone (DOLOPHINE) 10 MG/ML solution, Take 120 mg by mouth daily. [facility], Disp: , Rfl:    montelukast  (SINGULAIR ) 10 MG tablet, Take 1 tablet (10 mg total) by mouth at bedtime., Disp: 90 tablet, Rfl: 3   Objective:     Vitals:   03/10/24 1554  BP: 120/60  Pulse: 88  SpO2: 96%  Weight: 160 lb (72.6 kg)  Height: 5\' 4"  (1.626 m)      Body mass index is 27.46 kg/m.    Physical Exam:    Gen: Appears well, nad, nontoxic and pleasant Neuro:sensation intact, strength is 5/5 with df/pf/inv/ev, muscle tone wnl Skin: no suspicious lesion or defmority Psych: A&O, appropriate mood and affect  Left shoulder:  No  deformity, swelling or muscle wasting No scapular winging FF 180, abd 180, int 0, ext 90 NTTP over the [place], clavicle, ac, coracoid, biceps groove, humerus, deltoid, trapezius, cervical spine     Electronically signed by:  Marshall Skeeter D.[facility] 4:25 PM 03/10/24

## 2024-03-10 ENCOUNTER — Ambulatory Visit (INDEPENDENT_AMBULATORY_CARE_PROVIDER_SITE_OTHER): Payer: MEDICAID | Admitting: Sports Medicine

## 2024-03-10 VITALS — BP 120/60 | HR 88 | Ht 64.0 in | Wt 160.0 lb

## 2024-03-10 DIAGNOSIS — G8929 Other chronic pain: Secondary | ICD-10-CM | POA: Diagnosis not present

## 2024-03-10 DIAGNOSIS — M5126 Other intervertebral disc displacement, lumbar region: Secondary | ICD-10-CM | POA: Diagnosis not present

## 2024-03-10 DIAGNOSIS — M5441 Lumbago with sciatica, right side: Secondary | ICD-10-CM | POA: Diagnosis not present

## 2024-03-10 DIAGNOSIS — M25512 Pain in left shoulder: Secondary | ICD-10-CM

## 2024-03-31 ENCOUNTER — Other Ambulatory Visit (HOSPITAL_COMMUNITY)
Admission: RE | Admit: 2024-03-31 | Discharge: 2024-03-31 | Disposition: A | Discharge status: Home / Self Care | Payer: MEDICAID | Source: Ambulatory Visit | Attending: Physician Assistant | Admitting: Physician Assistant

## 2024-03-31 ENCOUNTER — Other Ambulatory Visit: Payer: MEDICAID

## 2024-03-31 ENCOUNTER — Ambulatory Visit: Payer: MEDICAID | Admitting: Physician Assistant

## 2024-03-31 VITALS — BP 130/90 | HR 98 | Temp 97.8°F | Ht 64.0 in | Wt 157.2 lb

## 2024-03-31 DIAGNOSIS — Z113 Encounter for screening for infections with a predominantly sexual mode of transmission: Secondary | ICD-10-CM | POA: Insufficient documentation

## 2024-03-31 DIAGNOSIS — Z0001 Encounter for general adult medical examination with abnormal findings: Secondary | ICD-10-CM

## 2024-03-31 DIAGNOSIS — F418 Other specified anxiety disorders: Secondary | ICD-10-CM | POA: Diagnosis not present

## 2024-03-31 DIAGNOSIS — R748 Abnormal levels of other serum enzymes: Secondary | ICD-10-CM | POA: Diagnosis not present

## 2024-03-31 DIAGNOSIS — N912 Amenorrhea, unspecified: Secondary | ICD-10-CM

## 2024-03-31 DIAGNOSIS — E663 Overweight: Secondary | ICD-10-CM

## 2024-03-31 MED ORDER — TRIAMCINOLONE ACETONIDE 0.1 % EX CREA
1.0000 | TOPICAL_CREAM | Freq: Two times a day (BID) | CUTANEOUS | 0 refills | Status: AC
Start: 2024-03-31 — End: ?

## 2024-03-31 NOTE — Progress Notes (Signed)
 Subjective:    Jacqueline Jimenez is a 48 y.o. female and is here for a comprehensive physical exam.  HPI  There are no preventive care reminders to display for this patient.  Acute Concerns: None  Chronic Issues: Anxiety / Depression Pt is on Lexapro  5 mg daily and Ativan  0.5 mg as needed. She reports a life stressor of taking care of her ex-husband. Pt lives with her ex, but has her own safe place. Endorses a good support system with her step daughter and parents. She states she has not been taking Ativan  since her last visit, except when she can't sleep. Pt would like to discuss Lexapro  at a later visit.  Seasonal allergies Pt is on Xyzal  5 mg daily and Singulair  10 mg daily. She reports occasional itchiness. Pt states she would like Triamcinolone  ointment for the itchiness.  Restless legs / back pain Pt has only taken Mirapex 1 mg twice since being prescribed it. She reports it makes her feel hungover, though it does have significant clinical effect. She also reports she has been sleeping on an air mattress recently which helps relieve her back pain. Pt notes she has received a shot recently and will be good for 6 weeks. She also is waiting for insurance to approve her MRI.  Weight management Pt shows interest in weight management. Pt is inquiring about Zepbound . She would like to visit possible treatment options at a later visit.  Health Maintenance: Immunizations -- UTD Colonoscopy -- UTD, last done 02/20/2023 and results were normal. 10 year repeat recommended, next due 02/19/2033. Mammogram -- UTD, last done 09/10/2023 and results show no mammographic evidence of malignancy. 1 year repeat recommended, next due 09/09/2024. PAP -- UTD, last done 12/17/2021 and results were NILM. 5 year repeat recommended, next due 12/17/2026. Bone Density -- N/a Diet -- Overall well-balanced diet. Exercise -- Regular exercise habits reported.  Sleep habits -- Good sleeping habits  reported. Mood -- Stable.  UTD with dentist? - No UTD with eye doctor? - UTD  Weight history: Wt Readings from Last 10 Encounters:  03/31/24 157 lb 4 oz (71.3 kg)  03/10/24 160 lb (72.6 kg)  02/27/24 157 lb 3.2 oz (71.3 kg)  02/18/24 164 lb (74.4 kg)  08/29/23 164 lb 4 oz (74.5 kg)  03/14/23 163 lb (73.9 kg)  02/20/23 160 lb (72.6 kg)  02/19/23 160 lb (72.6 kg)  02/06/23 160 lb (72.6 kg)  01/29/23 166 lb (75.3 kg)   Body mass index is 26.99 kg/m. No LMP recorded (lmp unknown). (Menstrual status: Irregular Periods).  Alcohol use:  reports current alcohol use.  Tobacco use:  Tobacco Use: Medium Risk (03/31/2024)   Patient History    Smoking Tobacco Use: Former    Smokeless Tobacco Use: Never    Passive Exposure: Not on file   Eligible for lung cancer screening? no     03/31/2024    3:54 PM  Depression screen PHQ 2/9  Decreased Interest 1  Down, Depressed, Hopeless 1  PHQ - 2 Score 2  Altered sleeping 1  Tired, decreased energy 1  Change in appetite 1  Feeling bad or failure about yourself  1  Trouble concentrating 0  Moving slowly or fidgety/restless 0  Suicidal thoughts 0  PHQ-9 Score 6  Difficult doing work/chores Somewhat difficult     Other providers/specialists: Patient Care Team: Alexander Iba, [place] as PCP - General (Physician Assistant)    PMHx, SurgHx, SocialHx, Medications, and Allergies were reviewed in the Visit  Navigator and updated as appropriate.   Past Medical History:  Diagnosis Date   Allergy    seasonal   Anxiety    Arthritis    Blood type, Rh negative    Depression    History of chicken pox    PID (pelvic inflammatory disease)    require hospitalizaiton at age 51   Pyloric stenosis    as an infant   Substance abuse (HCC)      Past Surgical History:  Procedure Laterality Date   APPENDECTOMY     calf augmentation     CESAREAN SECTION     club foot correction     DILATION AND CURETTAGE OF UTERUS     HERNIA REPAIR      RHINOPLASTY       Family History  Problem Relation Age of Onset   Asthma Mother    Breast cancer Mother 74       survivor   Seizures Mother        in her eye   Hypertension Father    Hyperlipidemia Father    Diabetes Maternal Grandmother    Breast cancer Maternal Grandmother 70   Breast cancer Other        unknown age and pased away   Colon polyps Neg Hx    Colon cancer Neg Hx    Esophageal cancer Neg Hx    Rectal cancer Neg Hx    Stomach cancer Neg Hx     Social History   Tobacco Use   Smoking status: Former    Current packs/day: 0.00    Average packs/day: 0.3 packs/day for 11.0 years (2.8 ttl pk-yrs)    Types: Cigarettes    Start date: 69    Quit date: 2006    Years since quitting: 19.4   Smokeless tobacco: Never  Vaping Use   Vaping status: Never Used  Substance Use Topics   Alcohol use: Yes    Comment: no binge drinking; social   Drug use: Not Currently    Types: Other-see comments    Comment: Opiates, on methadone    Review of Systems:   Review of Systems  Constitutional:  Negative for chills, fever, malaise/fatigue and weight loss.  HENT:  Negative for hearing loss, sinus pain and sore throat.   Respiratory:  Negative for cough and hemoptysis.   Cardiovascular:  Negative for chest pain, palpitations, leg swelling and PND.  Gastrointestinal:  Negative for abdominal pain, constipation, diarrhea, heartburn, nausea and vomiting.  Genitourinary:  Negative for dysuria, frequency and urgency.  Musculoskeletal:  Negative for back pain, myalgias and neck pain.  Skin:  Negative for itching and rash.  Neurological:  Negative for dizziness, tingling, seizures and headaches.  Endo/Heme/Allergies:  Negative for polydipsia.  Psychiatric/Behavioral:  Negative for depression. The patient is not nervous/anxious.       Objective:   BP (!) 130/90 (BP Location: Left Arm, Patient Position: Sitting, Cuff Size: Normal)   Pulse 98   Temp 97.8 F (36.6 C) (Temporal)    Ht 5' 4 (1.626 m)   Wt 157 lb 4 oz (71.3 kg)   LMP  (LMP Unknown)   SpO2 96%   BMI 26.99 kg/m  Body mass index is 26.99 kg/m.   General Appearance:    Alert, cooperative, no distress, appears stated age  Head:    Normocephalic, without obvious abnormality, atraumatic  Eyes:    PERRL, conjunctiva/corneas clear, EOM's intact, fundi    benign, both eyes  Ears:  Normal TM's and external ear canals, both ears  Nose:   Nares normal, septum midline, mucosa normal, no drainage    or sinus tenderness  Throat:   Lips, mucosa, and tongue normal; teeth and gums normal  Neck:   Supple, symmetrical, trachea midline, no adenopathy;    thyroid:  no enlargement/tenderness/nodules; no carotid   bruit or JVD  Back:     Symmetric, no curvature, ROM normal, no CVA tenderness  Lungs:     Clear to auscultation bilaterally, respirations unlabored  Chest Wall:    No tenderness or deformity   Heart:    Regular rate and rhythm, S1 and S2 normal, no murmur, rub or gallop  Breast Exam:    Deferred  Abdomen:     Soft, non-tender, bowel sounds active all four quadrants,    no masses, no organomegaly  Genitalia:    Deferred  Extremities:   Extremities normal, atraumatic, no cyanosis or edema  Pulses:   2+ and symmetric all extremities  Skin:   Skin color, texture, turgor normal, no rashes or lesions  Lymph nodes:   Cervical, supraclavicular, and axillary nodes normal  Neurologic:   CNII-XII intact, normal strength, sensation and reflexes    throughout    Assessment/Plan:   Encounter for general adult medical examination with abnormal findings Today patient counseled on age appropriate routine health concerns for screening and prevention, each reviewed and up to date or declined. Immunizations reviewed and up to date or declined. Labs ordered and reviewed. Risk factors for depression reviewed and negative. Hearing function and visual acuity are intact. ADLs screened and addressed as needed. Functional  ability and level of safety reviewed and appropriate. Education, counseling and referrals performed based on assessed risks today. Patient provided with a copy of personalized plan for preventive services.  Screening examination for STD (sexually transmitted disease) Update today  Anxious depression Well controlled overall Continue Lexapro  5 mg daily Continue efforts to limit ativan  use Follow up in 6 month(s), sooner if concerns  Elevated liver enzymes Recheck today  Overweight Continue efforts at weight loss Recommend follow up in 1 month to review weight May consider injectable GLP-1  Amenorrhea She is requesting pregnancy test today   I, Timoteo Force, acting as a Neurosurgeon for [employer], [place]., have documented all relevant documentation on the behalf of Alexander Iba, [place], as directed by  Alexander Iba, PA while in the presence of Alexander Iba, [place].  I, Alexander Iba, [place], have reviewed all documentation for this visit. The documentation on 03/31/24 for the exam, diagnosis, procedures, and orders are all accurate and complete.  Alexander Iba, PA-C [facility]

## 2024-03-31 NOTE — Patient Instructions (Signed)
 It was great to see you!  Follow up in 1 month for Virtual Office Visit (Video Appointment) to discuss weight loss!  Please go to the lab for blood work.   Our office will call you with your results unless you have chosen to receive results via MyChart.  If your blood work is normal we will follow-up each year for physicals and as scheduled for chronic medical problems.  If anything is abnormal we will treat accordingly and get you in for a follow-up.  Take care,  Lizvet Chunn

## 2024-04-01 ENCOUNTER — Ambulatory Visit: Payer: Self-pay | Admitting: Physician Assistant

## 2024-04-01 DIAGNOSIS — D72829 Elevated white blood cell count, unspecified: Secondary | ICD-10-CM

## 2024-04-01 LAB — COMPREHENSIVE METABOLIC PANEL WITH GFR
AG Ratio: 1.4 (calc) (ref 1.0–2.5)
ALT: 25 U/L (ref 6–29)
AST: 28 U/L (ref 10–35)
Albumin: 4.8 g/dL (ref 3.6–5.1)
Alkaline phosphatase (APISO): 94 U/L (ref 31–125)
BUN: 9 mg/dL (ref 7–25)
CO2: 23 mmol/L (ref 20–32)
Calcium: 10.4 mg/dL — ABNORMAL HIGH (ref 8.6–10.2)
Chloride: 96 mmol/L — ABNORMAL LOW (ref 98–110)
Creat: 0.62 mg/dL (ref 0.50–0.99)
Globulin: 3.5 g/dL (ref 1.9–3.7)
Glucose, Bld: 100 mg/dL — ABNORMAL HIGH (ref 65–99)
Potassium: 3.8 mmol/L (ref 3.5–5.3)
Sodium: 137 mmol/L (ref 135–146)
Total Bilirubin: 0.5 mg/dL (ref 0.2–1.2)
Total Protein: 8.3 g/dL — ABNORMAL HIGH (ref 6.1–8.1)
eGFR: 111 mL/min/{1.73_m2} (ref >=60)

## 2024-04-01 LAB — HEMOGLOBIN A1C
Hgb A1c MFr Bld: 6.1 % — ABNORMAL HIGH (ref <5.7)
Mean Plasma Glucose: 128 mg/dL
eAG (mmol/L): 7.1 mmol/L

## 2024-04-01 LAB — CBC WITH DIFFERENTIAL/PLATELET
Absolute Lymphocytes: 837 {cells}/uL — ABNORMAL LOW (ref 850–3900)
Absolute Monocytes: 711 {cells}/uL (ref 200–950)
Basophils Absolute: 16 {cells}/uL (ref 0–200)
Basophils Relative: 0.1 %
Eosinophils Absolute: 0 {cells}/uL — ABNORMAL LOW (ref 15–500)
Eosinophils Relative: 0 %
HCT: 38.4 % (ref 35.0–45.0)
Hemoglobin: 12.6 g/dL (ref 11.7–15.5)
MCH: 29.7 pg (ref 27.0–33.0)
MCHC: 32.8 g/dL (ref 32.0–36.0)
MCV: 90.6 fL (ref 80.0–100.0)
MPV: 9.5 fL (ref 7.5–12.5)
Monocytes Relative: 4.5 %
Neutro Abs: 14236 {cells}/uL — ABNORMAL HIGH (ref 1500–7800)
Neutrophils Relative %: 90.1 %
Platelets: 365 10*3/uL (ref 140–400)
RBC: 4.24 10*6/uL (ref 3.80–5.10)
RDW: 14.8 % (ref 11.0–15.0)
Total Lymphocyte: 5.3 %
WBC: 15.8 10*3/uL — ABNORMAL HIGH (ref 3.8–10.8)

## 2024-04-01 LAB — LIPID PANEL
Cholesterol: 315 mg/dL — ABNORMAL HIGH (ref <200)
HDL: 81 mg/dL (ref >=50)
LDL Cholesterol (Calc): 209 mg/dL — ABNORMAL HIGH
Non-HDL Cholesterol (Calc): 234 mg/dL — ABNORMAL HIGH (ref <130)
Total CHOL/HDL Ratio: 3.9 (calc) (ref <5.0)
Triglycerides: 111 mg/dL (ref <150)

## 2024-04-01 LAB — HIV ANTIBODY (ROUTINE TESTING W REFLEX): HIV 1&2 Ab, 4th Generation: NONREACTIVE

## 2024-04-01 LAB — HCG, QUANTITATIVE, PREGNANCY: HCG, Total, QN: <5 m[IU]/mL

## 2024-04-01 LAB — RPR: RPR Ser Ql: NONREACTIVE

## 2024-04-02 LAB — CERVICOVAGINAL ANCILLARY ONLY
Bacterial Vaginitis (gardnerella): POSITIVE — AB
Candida Glabrata: NEGATIVE
Candida Vaginitis: NEGATIVE
Chlamydia: NEGATIVE
Comment: NEGATIVE
Comment: NEGATIVE
Comment: NEGATIVE
Comment: NEGATIVE
Comment: NEGATIVE
Comment: NORMAL
Neisseria Gonorrhea: NEGATIVE
Trichomonas: NEGATIVE

## 2024-04-04 MED ORDER — METRONIDAZOLE 0.75 % VA GEL: 1.0000 | Freq: Every day | VAGINAL | 0 refills | Status: DC

## 2024-04-06 ENCOUNTER — Other Ambulatory Visit: Payer: Self-pay | Admitting: Physician Assistant

## 2024-04-07 NOTE — Telephone Encounter (Signed)
 Pt requesting refill for Lorazepam  0.5 mg tablet. Last OV 03/31/2024.

## 2024-04-27 ENCOUNTER — Ambulatory Visit: Payer: MEDICAID | Admitting: Dermatology

## 2024-04-27 ENCOUNTER — Encounter: Payer: Self-pay | Admitting: Dermatology

## 2024-04-27 DIAGNOSIS — L578 Other skin changes due to chronic exposure to nonionizing radiation: Secondary | ICD-10-CM

## 2024-04-27 DIAGNOSIS — D224 Melanocytic nevi of scalp and neck: Secondary | ICD-10-CM | POA: Diagnosis not present

## 2024-04-27 DIAGNOSIS — W908XXA Exposure to other nonionizing radiation, initial encounter: Secondary | ICD-10-CM

## 2024-04-27 DIAGNOSIS — D492 Neoplasm of unspecified behavior of bone, soft tissue, and skin: Secondary | ICD-10-CM

## 2024-04-27 DIAGNOSIS — L918 Other hypertrophic disorders of the skin: Secondary | ICD-10-CM | POA: Diagnosis not present

## 2024-04-27 DIAGNOSIS — Z1283 Encounter for screening for malignant neoplasm of skin: Secondary | ICD-10-CM | POA: Diagnosis not present

## 2024-04-27 DIAGNOSIS — L814 Other melanin hyperpigmentation: Secondary | ICD-10-CM | POA: Diagnosis not present

## 2024-04-27 DIAGNOSIS — R234 Changes in skin texture: Secondary | ICD-10-CM

## 2024-04-27 DIAGNOSIS — L821 Other seborrheic keratosis: Secondary | ICD-10-CM

## 2024-04-27 DIAGNOSIS — Z79899 Other long term (current) drug therapy: Secondary | ICD-10-CM

## 2024-04-27 DIAGNOSIS — D1802 Hemangioma of intracranial structures: Secondary | ICD-10-CM

## 2024-04-27 DIAGNOSIS — Z7189 Other specified counseling: Secondary | ICD-10-CM

## 2024-04-27 DIAGNOSIS — D229 Melanocytic nevi, unspecified: Secondary | ICD-10-CM

## 2024-04-27 MED ORDER — MUPIROCIN 2 % EX OINT: TOPICAL_OINTMENT | CUTANEOUS | 0 refills | Status: DC

## 2024-04-27 NOTE — Patient Instructions (Addendum)

## 2024-04-27 NOTE — Progress Notes (Signed)
 Follow-Up Visit   Subjective  Jacqueline Jimenez is a 48 y.o. female who presents for the following: Skin Cancer Screening and Full Body Skin Exam. Patient with fair skin, no known hx of skin cancer.   The patient presents for Total-Body Skin Exam (TBSE) for skin cancer screening and mole check. The patient has spots, moles and lesions to be evaluated, some may be new or changing and the patient may have concern these could be cancer.  The following portions of the chart were reviewed this encounter and updated as appropriate: medications, allergies, medical history  Review of Systems:  No other skin or systemic complaints except as noted in HPI or Assessment and Plan.  Objective  Well appearing patient in no apparent distress; mood and affect are within normal limits.  A full examination was performed including scalp, head, eyes, ears, nose, lips, neck, chest, axillae, abdomen, back, buttocks, bilateral upper extremities, bilateral lower extremities, hands, feet, fingers, toes, fingernails, and toenails. All findings within normal limits unless otherwise noted below.   Relevant physical exam findings are noted in the Assessment and Plan.  right post neck lateral 0.6 cm flesh papule right post neck medial 0.3 cm flesh papule  left lateral neck 0.6 cm flesh papule  Left neck medial 0.3 cm flesh papule        Assessment & Plan   SKIN CANCER SCREENING PERFORMED TODAY.  ACTINIC DAMAGE - Chronic condition, secondary to cumulative UV/sun exposure - diffuse scaly erythematous macules with underlying dyspigmentation - Recommend daily broad spectrum sunscreen SPF 30+ to sun-exposed areas, reapply every 2 hours as needed.  - Staying in the shade or wearing long sleeves, sun glasses (UVA+UVB protection) and wide brim hats (4-inch brim around the entire circumference of the hat) are also recommended for sun protection.  - Call for new or changing lesions.  LENTIGINES, SEBORRHEIC  KERATOSES, HEMANGIOMAS - Benign normal skin lesions - Benign-appearing - Call for any changes  MELANOCYTIC NEVI - Tan-brown and/or pink-flesh-colored symmetric macules and papules - Benign appearing on exam today - Observation - Call clinic for new or changing moles - Recommend daily use of broad spectrum spf 30+ sunscreen to sun-exposed areas.   FISSURE Left medial heel  Start Mupirocin  ointment apply to affected skin qd-bid   NEOPLASM OF SKIN (4) right post neck lateral Epidermal / dermal shaving  Lesion diameter (cm):  0.6 Informed consent: discussed and consent obtained   Timeout: patient name, date of birth, surgical site, and procedure verified   Procedure prep:  Patient was prepped and draped in usual sterile fashion Prep type:  Isopropyl alcohol Anesthesia: the lesion was anesthetized in a standard fashion   Anesthetic:  1% lidocaine  w/ epinephrine 1-100,000 buffered w/ 8.4% NaHCO3 Hemostasis achieved with: pressure, aluminum chloride and electrodesiccation   Outcome: patient tolerated procedure well   Post-procedure details: sterile dressing applied and wound care instructions given   Dressing type: bandage and petrolatum    Specimen 1 - Surgical pathology Differential Diagnosis: R/O Irritated nevus vs Dysplastic nevus   Check Margins: No right post neck medial Epidermal / dermal shaving  Lesion diameter (cm):  0.3 Informed consent: discussed and consent obtained   Timeout: patient name, date of birth, surgical site, and procedure verified   Procedure prep:  Patient was prepped and draped in usual sterile fashion Prep type:  Isopropyl alcohol Anesthesia: the lesion was anesthetized in a standard fashion   Anesthetic:  1% lidocaine  w/ epinephrine 1-100,000 buffered w/ 8.4% NaHCO3  Hemostasis achieved with: pressure, aluminum chloride and electrodesiccation   Outcome: patient tolerated procedure well   Post-procedure details: sterile dressing applied and wound  care instructions given   Dressing type: bandage and petrolatum    Specimen 2 - Surgical pathology Differential Diagnosis: R/O Irritated nevus vs Dysplastic nevus  Check Margins: No left lateral neck Epidermal / dermal shaving  Lesion diameter (cm):  0.6 Informed consent: discussed and consent obtained   Timeout: patient name, date of birth, surgical site, and procedure verified   Patient was prepped and draped in usual sterile fashion: area prepped with alcohol. Anesthesia: the lesion was anesthetized in a standard fashion   Anesthetic:  1% lidocaine  w/ epinephrine 1-100,000 buffered w/ 8.4% NaHCO3 Instrument used: flexible razor blade   Hemostasis achieved with: pressure, aluminum chloride and electrodesiccation   Outcome: patient tolerated procedure well   Post-procedure details: wound care instructions given   Post-procedure details comment:  Ointment and small bandage applied  Specimen 3 - Surgical pathology Differential Diagnosis: R/O Irritated nevus vs Dysplastic nevus  Check Margins: No Left neck medial Epidermal / dermal shaving  Lesion diameter (cm):  0.3 Informed consent: discussed and consent obtained   Timeout: patient name, date of birth, surgical site, and procedure verified   Procedure prep:  Patient was prepped and draped in usual sterile fashion Prep type:  Isopropyl alcohol Anesthesia: the lesion was anesthetized in a standard fashion   Anesthetic:  1% lidocaine  w/ epinephrine 1-100,000 buffered w/ 8.4% NaHCO3 Hemostasis achieved with: pressure, aluminum chloride and electrodesiccation   Outcome: patient tolerated procedure well   Post-procedure details: sterile dressing applied and wound care instructions given   Dressing type: bandage and petrolatum    Specimen 4 - Surgical pathology Differential Diagnosis: R/O Irritated nevus vs Dysplastic nevus   Check Margins: No   Return in about 1 year (around 04/27/2025) for TBSE.  IFay Kirks, CMA, am  acting as scribe for Alm Rhyme, MD .   Documentation: I have reviewed the above documentation for accuracy and completeness, and I agree with the above.  Alm Rhyme, MD

## 2024-04-29 ENCOUNTER — Ambulatory Visit: Payer: Self-pay | Admitting: Dermatology

## 2024-04-29 LAB — SURGICAL PATHOLOGY

## 2024-04-30 ENCOUNTER — Telehealth (INDEPENDENT_AMBULATORY_CARE_PROVIDER_SITE_OTHER): Payer: MEDICAID | Admitting: Physician Assistant

## 2024-04-30 ENCOUNTER — Encounter: Payer: Self-pay | Admitting: Physician Assistant

## 2024-04-30 VITALS — Ht 64.0 in | Wt 165.0 lb

## 2024-04-30 DIAGNOSIS — E785 Hyperlipidemia, unspecified: Secondary | ICD-10-CM

## 2024-04-30 DIAGNOSIS — E663 Overweight: Secondary | ICD-10-CM

## 2024-04-30 DIAGNOSIS — K760 Fatty (change of) liver, not elsewhere classified: Secondary | ICD-10-CM | POA: Diagnosis not present

## 2024-04-30 MED ORDER — ZEPBOUND 2.5 MG/0.5ML ~~LOC~~ SOAJ: 2.5000 mg | SUBCUTANEOUS | 0 refills | Status: DC
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
## 2024-05-03 NOTE — Telephone Encounter (Signed)
Left message on voicemail to return my call.  

## 2024-05-03 NOTE — Telephone Encounter (Signed)
-----   Message from Alm Rhyme sent at 04/29/2024  7:51 PM EDT ----- FINAL DIAGNOSIS        1. Skin, right post neck lateral :       MELANOCYTIC NEVUS, INTRADERMAL TYPE, BASE INVOLVED        2. Skin, right post neck medial :       MELANOCYTIC NEVUS, INTRADERMAL TYPE, BASE INVOLVED        3. Skin, left lateral neck :       MELANOCYTIC NEVUS, INTRADERMAL TYPE, BASE INVOLVED        4. Skin, left neck medial :       [place]   1,2,3 - all three benign Mole No further treatment needed 4- benign skin tag No further treatment needed ----- Message ----- From: Interface, Lab In Three Zero Seven Sent: 04/29/2024   5:38 PM EDT To: Alm JAYSON Rhyme, MD

## 2024-05-04 ENCOUNTER — Other Ambulatory Visit (HOSPITAL_COMMUNITY): Payer: Self-pay

## 2024-05-04 ENCOUNTER — Telehealth: Payer: Self-pay

## 2024-05-04 NOTE — Telephone Encounter (Signed)
 Pharmacy Patient Advocate Encounter  Received notification from [facility] that Prior Authorization for Zepbound  2.5 has been APPROVED from 05/04/24 to 11/04/24. Ran test claim, Copay is $4.00. This test claim was processed through [facility]- copay amounts may vary at other pharmacies due to pharmacy/plan contracts, or as the patient moves through the different stages of their insurance plan.   PA #/Case ID/Reference #: [id number]

## 2024-05-04 NOTE — Telephone Encounter (Addendum)
 Tried calling patient regarding results. No answer. LM for patient to return call.   ----- Message from Alm Rhyme sent at 04/29/2024  7:51 PM EDT ----- FINAL DIAGNOSIS        1. Skin, right post neck lateral :       MELANOCYTIC NEVUS, INTRADERMAL TYPE, BASE INVOLVED        2. Skin, right post neck medial :       MELANOCYTIC NEVUS, INTRADERMAL TYPE, BASE INVOLVED        3. Skin, left lateral neck :       MELANOCYTIC NEVUS, INTRADERMAL TYPE, BASE INVOLVED        4. Skin, left neck medial :       [place]   1,2,3 - all three benign Mole No further treatment needed 4- benign skin tag No further treatment needed ----- Message ----- From: Interface, Lab In Three Zero Seven Sent: 04/29/2024   5:38 PM EDT To: Alm JAYSON Rhyme, MD

## 2024-05-04 NOTE — Telephone Encounter (Signed)
 Pharmacy Patient Advocate Encounter   Received notification from CoverMyMeds that prior authorization for Zepbound  2.5 is required/requested.   Insurance verification completed.   The patient is insured through [facility] .   Per test claim: PA required; PA submitted to above mentioned insurance via CoverMyMeds Key/confirmation #/EOC [id number] Status is pending

## 2024-05-05 NOTE — Telephone Encounter (Addendum)
 Called and discussed bx results with patient. She verbalized understanding and denied further questions. Patient states she is ok with leaving detailed messages on voicemail in future for biopsy results.   ----- Message from Alm Rhyme sent at 04/29/2024  7:51 PM EDT ----- FINAL DIAGNOSIS        1. Skin, right post neck lateral :       MELANOCYTIC NEVUS, INTRADERMAL TYPE, BASE INVOLVED        2. Skin, right post neck medial :       MELANOCYTIC NEVUS, INTRADERMAL TYPE, BASE INVOLVED        3. Skin, left lateral neck :       MELANOCYTIC NEVUS, INTRADERMAL TYPE, BASE INVOLVED        4. Skin, left neck medial :       [place]   1,2,3 - all three benign Mole No further treatment needed 4- benign skin tag No further treatment needed ----- Message ----- From: Interface, Lab In Three Zero Seven Sent: 04/29/2024   5:38 PM EDT To: Alm JAYSON Rhyme, MD

## 2024-05-10 ENCOUNTER — Other Ambulatory Visit: Payer: Self-pay | Admitting: Physician Assistant

## 2024-05-10 NOTE — Telephone Encounter (Signed)
 Pt requesting refill for Lorazepam  0.5 mg tablet. Last OV 03/31/2024.

## 2024-05-25 ENCOUNTER — Encounter: Payer: Self-pay | Admitting: Physician Assistant

## 2024-06-03 ENCOUNTER — Ambulatory Visit: Payer: MEDICAID

## 2024-06-03 ENCOUNTER — Encounter: Payer: Self-pay | Admitting: Physician Assistant

## 2024-06-03 ENCOUNTER — Ambulatory Visit
Admission: RE | Admit: 2024-06-03 | Discharge: 2024-06-03 | Disposition: A | Discharge status: Home / Self Care | Payer: MEDICAID | Source: Ambulatory Visit | Attending: Physician Assistant | Admitting: Physician Assistant

## 2024-06-03 ENCOUNTER — Telehealth (INDEPENDENT_AMBULATORY_CARE_PROVIDER_SITE_OTHER): Payer: MEDICAID | Admitting: Physician Assistant

## 2024-06-03 VITALS — Ht 64.0 in | Wt 165.0 lb

## 2024-06-03 DIAGNOSIS — R0781 Pleurodynia: Secondary | ICD-10-CM | POA: Diagnosis not present
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
## 2024-06-15 NOTE — Progress Notes (Deleted)
    Ben Jackson D.[facility] [address] [place] [address] Phone: [phone]   Assessment and Plan:         ***   Pertinent previous records reviewed include ***    Follow Up: ***     Subjective:   I, Kailynn Satterly, am serving as a Neurosurgeon for Doctor Morene Mace  Chief Complaint: left shoulder pain   HPI:   06/16/2024 Patient is a 48 year old female with left shoulder pain. Patient states  Relevant Historical Information: ***  Additional pertinent review of systems negative.   Current Outpatient Medications:    albuterol  (VENTOLIN  HFA) 108 (90 Base) MCG/ACT inhaler, Inhale 2 puffs into the lungs every 4 (four) hours as needed for wheezing or shortness of breath., Disp: 1 each, Rfl: 2   escitalopram  (LEXAPRO ) 5 MG tablet, Take 1 tablet (5 mg total) by mouth daily., Disp: 90 tablet, Rfl: 0   fluticasone (FLONASE) 50 MCG/ACT nasal spray, Place 1 spray into both nostrils daily., Disp: , Rfl:    ibuprofen  (ADVIL ,MOTRIN ) 200 MG tablet, Take 400 mg by mouth every 6 (six) hours as needed for moderate pain., Disp: , Rfl:    levocetirizine (XYZAL ) 5 MG tablet, Take 1 tablet (5 mg total) by mouth every evening., Disp: 90 tablet, Rfl: 3   LORazepam  (ATIVAN ) 0.5 MG tablet, Take 1 tablet (0.5 mg total) by mouth daily as needed for anxiety., Disp: 20 tablet, Rfl: 0   methadone (DOLOPHINE) 10 MG/ML solution, Take 120 mg by mouth daily. [facility], Disp: , Rfl:    metroNIDAZOLE  (METROGEL ) 0.75 % vaginal gel, Place 1 Applicatorful vaginally at bedtime. Complete for 5 total days., Disp: 70 g, Rfl: 0   montelukast  (SINGULAIR ) 10 MG tablet, Take 1 tablet (10 mg total) by mouth at bedtime., Disp: 90 tablet, Rfl: 3   mupirocin  ointment (BACTROBAN ) 2 %, Apply to skin qd-bid, Disp: 22 g, Rfl: 0   pramipexole (MIRAPEX) 1 MG tablet, Take 1 mg by mouth daily in the afternoon., Disp: , Rfl:    tirzepatide  (ZEPBOUND ) 2.5 MG/0.5ML Pen,  Inject 2.5 mg into the skin once a week., Disp: 2 mL, Rfl: 0   triamcinolone  cream (KENALOG ) 0.1 %, Apply 1 Application topically 2 (two) times daily., Disp: 30 g, Rfl: 0   Objective:     There were no vitals filed for this visit.    There is no height or weight on file to calculate BMI.    Physical Exam:    ***   Electronically signed by:  Odis Mace D.[facility] 7:50 AM 06/15/24

## 2024-06-16 ENCOUNTER — Ambulatory Visit: Payer: MEDICAID | Admitting: Sports Medicine

## 2024-06-17 ENCOUNTER — Other Ambulatory Visit: Payer: Self-pay | Admitting: Physician Assistant

## 2024-06-17 NOTE — Telephone Encounter (Signed)
 Pt requesting refill for Lorazepam  0.5 mg tablet. Last OV 03/31/2024.

## 2024-06-18 ENCOUNTER — Ambulatory Visit: Payer: Self-pay | Admitting: Physician Assistant

## 2024-06-22 ENCOUNTER — Encounter: Payer: Self-pay | Admitting: Sports Medicine

## 2024-07-21 ENCOUNTER — Other Ambulatory Visit: Payer: Self-pay | Admitting: Medical Genetics

## 2024-07-21 ENCOUNTER — Other Ambulatory Visit: Payer: Self-pay | Admitting: Physician Assistant

## 2024-07-21 NOTE — Telephone Encounter (Signed)
 Pt requesting refill for Lorazepam  0.5 mg tablet. Last OV 03/31/2024, telemedicine visit 06/03/2024.

## 2024-07-27 ENCOUNTER — Other Ambulatory Visit: Payer: Self-pay | Admitting: Physician Assistant

## 2024-07-29 ENCOUNTER — Other Ambulatory Visit (HOSPITAL_COMMUNITY): Payer: Self-pay

## 2024-07-29 ENCOUNTER — Telehealth: Payer: Self-pay

## 2024-07-29 NOTE — Telephone Encounter (Signed)
 Left message on voicemail to call office.

## 2024-07-29 NOTE — Telephone Encounter (Signed)
 Pharmacy Patient Advocate Encounter   Received notification from CoverMyMeds that prior authorization for Zepbound  2.5MG /0.5ML pen-injectors is required/requested.   Insurance verification completed.   The patient is insured through [facility] [employer]ICA[employer].   Per test claim: PA required; PA submitted to above mentioned insurance via Latent Key/confirmation #/EOC [id number] Status is pending

## 2024-07-29 NOTE — Telephone Encounter (Signed)

## 2024-07-29 NOTE — Telephone Encounter (Signed)
 Noted

## 2024-08-05 ENCOUNTER — Other Ambulatory Visit (HOSPITAL_COMMUNITY): Payer: MEDICAID

## 2024-08-23 NOTE — Progress Notes (Signed)
 Ben Tammi Boulier D.[facility] [address] [place] [address] Phone: [phone]   Assessment and Plan:     1. Chronic pain of both knees (Primary) 2. Patellar tendinitis of both knees -Chronic with exacerbation, initial visit - 3+ months of anterior bilateral knee pain without specific MOI.  Most consistent with patellar tendinitis - X-rays obtained in clinic.  My interpretation: No acute fracture or dislocation - Offered meloxicam  course versus prednisone  course.  Patient prefers to start with topical medications and can call if she would like meloxicam  or prednisone  course in the future if pain does not resolve - May use topical Voltaren gel over areas of pain - Start HEP for bilateral knees and patellar tendinitis   15 additional minutes spent for educating Therapeutic Home Exercise Program.  This included exercises focusing on stretching, strengthening, with focus on eccentric aspects.   Long term goals include an improvement in range of motion, strength, endurance as well as avoiding reinjury. Patient's frequency would include in 1-2 times a day, 3-5 times a week for a duration of 6-12 weeks. Proper technique shown and discussed handout in great detail with ATC.  All questions were discussed and answered.    Pertinent previous records reviewed include none   Follow Up: As needed if no improvement or worsening of symptoms to further discuss knee pain.  Could also discuss ongoing elbow pain.  Could consider repeat OMT.  Patient can call for prednisone  or meloxicam  course  Subjective:   I, Jacqueline Jimenez, am serving as a neurosurgeon for [employer]  Chief Complaint: Recurrent back pain   HPI:  Patient states that she did not get the epidural as they did not take her insurance. Pain is shooting down her R leg when standing, sleeping, sitting. Patient is currently on leave from work since May 6th and would like paperwork filled out for  absence.    02/18/2024 Patient states back pain is pretty bad today 10/10 pain. Sciatic is messing up today as well down her whole leg. Has been going on since yesterday,    03/10/2024 Patient states that she is doing ok. Saw Dr. Debby and he ordered a new MRI and will proceed with surgical options are available. Going back to get another back injection next week.   08/30/2024 Patient states knee pain for a couple of months. Pain when she wakes up. Decreased ROM. No MOI.states she has the same feeling in her elbows. No numbness or tingling. No meds due to the pain subsiding as the day goes    Relevant Historical Information: None pertinent  Additional pertinent review of systems negative.   Current Outpatient Medications:    albuterol  (VENTOLIN  HFA) 108 (90 Base) MCG/ACT inhaler, Inhale 2 puffs into the lungs every 4 (four) hours as needed for wheezing or shortness of breath., Disp: 1 each, Rfl: 2   escitalopram  (LEXAPRO ) 5 MG tablet, Take 1 tablet (5 mg total) by mouth daily., Disp: 90 tablet, Rfl: 0   fluticasone (FLONASE) 50 MCG/ACT nasal spray, Place 1 spray into both nostrils daily., Disp: , Rfl:    ibuprofen  (ADVIL ,MOTRIN ) 200 MG tablet, Take 400 mg by mouth every 6 (six) hours as needed for moderate pain., Disp: , Rfl:    levocetirizine (XYZAL ) 5 MG tablet, Take 1 tablet (5 mg total) by mouth every evening., Disp: 90 tablet, Rfl: 3   LORazepam  (ATIVAN ) 0.5 MG tablet, Take 1 tablet (0.5 mg total) by mouth daily as needed  for anxiety., Disp: 20 tablet, Rfl: 0   methadone (DOLOPHINE) 10 MG/ML solution, Take 120 mg by mouth daily. [facility], Disp: , Rfl:    metroNIDAZOLE  (METROGEL ) 0.75 % vaginal gel, Place 1 Applicatorful vaginally at bedtime. Complete for 5 total days., Disp: 70 g, Rfl: 0   montelukast  (SINGULAIR ) 10 MG tablet, Take 1 tablet (10 mg total) by mouth at bedtime., Disp: 90 tablet, Rfl: 3   mupirocin  ointment (BACTROBAN ) 2 %, Apply to skin qd-bid, Disp: 22  g, Rfl: 0   pramipexole (MIRAPEX) 1 MG tablet, Take 1 mg by mouth daily in the afternoon., Disp: , Rfl:    triamcinolone  cream (KENALOG ) 0.1 %, Apply 1 Application topically 2 (two) times daily., Disp: 30 g, Rfl: 0   ZEPBOUND  2.5 MG/0.5ML Pen, Inject 2.5 mg into the skin once a week., Disp: 2 mL, Rfl: 0   Objective:     Vitals:   08/30/24 1452  Pulse: 95  SpO2: 97%  Weight: 153 lb (69.4 kg)  Height: 5' 4 (1.626 m)      Body mass index is 26.26 kg/m.    Physical Exam:    General:  awake, alert oriented, no acute distress nontoxic Skin: no suspicious lesions or rashes Neuro:sensation intact and strength 5/5 with no deficits, no atrophy, normal muscle tone Psych: No signs of anxiety, depression or other mood disorder  Bilateral knee: No swelling No deformity Neg fluid wave, joint milking ROM Flex 110, Ext 0 TTP patella, patellar tendon NTTP over the quad tendon, medial fem condyle, lat fem condyle,  plica,   tibial tuberostiy, fibular head, posterior fossa, pes anserine bursa, gerdy's tubercle, medial jt line, lateral jt line Neg anterior and posterior drawer Neg lachman Neg sag sign Negative varus stress Negative valgus stress Negative McMurray Positive bilateral Thessaly  Gait normal    Electronically signed by:  Odis Mace D.[facility] 3:40 PM 08/30/24

## 2024-08-25 ENCOUNTER — Other Ambulatory Visit: Payer: Self-pay | Admitting: Physician Assistant

## 2024-08-25 NOTE — Telephone Encounter (Signed)
 Pt requesting refill for Lorazepam  0.5 mg tablet. Last OV 03/2024.

## 2024-08-30 ENCOUNTER — Ambulatory Visit: Payer: MEDICAID

## 2024-08-30 ENCOUNTER — Ambulatory Visit (INDEPENDENT_AMBULATORY_CARE_PROVIDER_SITE_OTHER): Payer: MEDICAID | Admitting: Sports Medicine

## 2024-08-30 VITALS — HR 95 | Ht 64.0 in | Wt 153.0 lb

## 2024-08-30 DIAGNOSIS — M7651 Patellar tendinitis, right knee: Secondary | ICD-10-CM | POA: Diagnosis not present

## 2024-08-30 DIAGNOSIS — M25562 Pain in left knee: Secondary | ICD-10-CM

## 2024-08-30 DIAGNOSIS — M25561 Pain in right knee: Secondary | ICD-10-CM | POA: Diagnosis not present

## 2024-08-30 DIAGNOSIS — G8929 Other chronic pain: Secondary | ICD-10-CM

## 2024-08-30 DIAGNOSIS — M7652 Patellar tendinitis, left knee: Secondary | ICD-10-CM

## 2024-08-30 NOTE — Patient Instructions (Signed)
 Knee and elbow HEP   Voltaren gel over areas of pain   Call us  if you would like a prescription of meloxicam  or prednisone  dos pak   As needed follow up for MSK or further discuss of knee and elbow

## 2024-09-02 ENCOUNTER — Ambulatory Visit: Payer: Self-pay | Admitting: Sports Medicine

## 2024-09-13 LAB — BASIC METABOLIC PANEL WITH GFR
BUN: 6 (ref 4–21)
CO2: 30 — AB (ref 13–22)
Chloride: 100 (ref 99–108)
Creatinine: 0.7 (ref 0.5–1.1)
Glucose: 0
Potassium: 4.7 meq/L (ref 3.5–5.1)
Sodium: 137 (ref 137–147)

## 2024-09-13 LAB — HEPATIC FUNCTION PANEL
ALT: 24 U/L (ref 7–35)
AST: 32 (ref 13–35)
Alkaline Phosphatase: 77 (ref 25–125)
Bilirubin, Total: 0.3

## 2024-09-13 LAB — CBC AND DIFFERENTIAL
HCT: 39 (ref 36–46)
Hemoglobin: 13 (ref 12.0–16.0)
Neutrophils Absolute: 4.85
Platelets: 217 K/uL (ref 150–400)
WBC: 7.7

## 2024-09-13 LAB — HM HEPATITIS C SCREENING LAB: HM Hepatitis Screen: NEGATIVE

## 2024-09-13 LAB — CBC: RBC: 3.88 (ref 3.87–5.11)

## 2024-09-13 LAB — COMPREHENSIVE METABOLIC PANEL WITH GFR
Albumin: 3.5 (ref 3.5–5.0)
Calcium: 9.2 (ref 8.7–10.7)
eGFR: 105

## 2024-09-29 ENCOUNTER — Other Ambulatory Visit: Payer: Self-pay | Admitting: Physician Assistant

## 2024-09-29 NOTE — Telephone Encounter (Signed)
 Pt requesting refill for Lorazepam  0.5 mg tablet. Last OV 03/31/2024.

## 2024-10-05 ENCOUNTER — Encounter: Payer: Self-pay | Admitting: Physician Assistant

## 2024-10-05 ENCOUNTER — Ambulatory Visit (INDEPENDENT_AMBULATORY_CARE_PROVIDER_SITE_OTHER): Payer: MEDICAID | Admitting: Physician Assistant

## 2024-10-05 VITALS — BP 130/86 | HR 88 | Temp 97.9°F | Ht 64.0 in | Wt 160.0 lb

## 2024-10-05 DIAGNOSIS — F418 Other specified anxiety disorders: Secondary | ICD-10-CM

## 2024-10-05 DIAGNOSIS — Z23 Encounter for immunization: Secondary | ICD-10-CM

## 2024-10-05 DIAGNOSIS — M5441 Lumbago with sciatica, right side: Secondary | ICD-10-CM

## 2024-10-05 DIAGNOSIS — E663 Overweight: Secondary | ICD-10-CM | POA: Diagnosis not present

## 2024-10-05 DIAGNOSIS — G8929 Other chronic pain: Secondary | ICD-10-CM | POA: Diagnosis not present

## 2024-10-05 DIAGNOSIS — R202 Paresthesia of skin: Secondary | ICD-10-CM

## 2024-10-05 MED ORDER — TIRZEPATIDE-WEIGHT MANAGEMENT 2.5 MG/0.5ML ~~LOC~~ SOLN: 2.5000 mg | SUBCUTANEOUS | 2 refills | Status: AC

## 2024-10-05 MED ORDER — LORAZEPAM 0.5 MG PO TABS: 0.5000 mg | ORAL_TABLET | Freq: Two times a day (BID) | ORAL | 1 refills | Status: AC | PRN

## 2024-10-05 NOTE — Patient Instructions (Signed)
°  VISIT SUMMARY: During your visit, we discussed your weight management, medication refills, and several other health concerns. We have made adjustments to your medications and provided recommendations for managing your symptoms.  YOUR PLAN: OBESITY: You have gained about 10 pounds since your last visit and previously experienced nausea with Tzhncb. -We have prescribed Zepbound  at the lowest dose for self-administration. The prescription has been sent to [facility] for mail order or local pickup.  GENERALIZED ANXIETY DISORDER AND MAJOR DEPRESSIVE DISORDER: Your anxiety and depression are managed with Lexapro  and lorazepam . You use lorazepam  infrequently. -Continue taking Lexapro  5 mg daily. -We have prescribed a two-month supply of lorazepam  with a refill. A six-month follow-up is required for DEA compliance.  RESTLESS LEGS SYNDROME: You experience intermittent symptoms, possibly related to sciatica. -No current medication is needed since symptoms are intermittent. Previous medication was effective during episodes.  SCIATICA: You have occasional pain that migrates, possibly due to a pinched nerve. -Symptoms have improved with reduced job stress. Continue to monitor and manage stress levels.  TENDINITIS: You have pain in your knees and elbows, confirmed by x-ray. -Continue using topical Voltaren for relief.  PARESTHESIA OF HANDS: You experience intermittent tingling in your hands, likely due to your sleeping posture. -Monitor your symptoms and try to adjust your sleeping posture.  GENERAL HEALTH MAINTENANCE: You received a flu shot and had slightly elevated liver enzyme and non-fasting blood sugar levels in recent blood work. -Continue routine health maintenance and monitor your blood work.                      Contains text generated by [employer]bridge.                                 Contains text generated by Abridge.

## 2024-10-05 NOTE — Progress Notes (Signed)
 History of Present Illness:   Chief Complaint  Patient presents with   Medical Management of Chronic Issues    Pt here for 6 month f/u Anxiety/ Depression, medication refill.    Discussed the use of AI scribe software for clinical note transcription with the patient, who gave verbal consent to proceed.  History of Present Illness   Jacqueline Jimenez is a 48 year old female who presents for a follow-up regarding weight management and medication refills.  She was previously on [facility] for weight loss but stopped due to significant nausea and loss of insurance coverage as of October 1. She is not on any weight loss medication now and has gained about 10 pounds since her last Sports Medicine visit.  She takes Lexapro  5 mg daily for mental health and feels it is effective. She uses Ativan  a few times per week and is mindful about not overusing it. Her recent Ativan  refill was rejected due to the need for a six-month follow-up.  She has restless leg syndrome but is not currently taking medication for it because symptoms are intermittent. She previously found treatment effective when she used it.  She reports random leg pain that has shifted to the opposite leg. She had a knee x-ray that was normal but has pain when getting out of bed. She uses topical Voltaren for relief. She notes she lost her job for medical reasons.  She experiences tingling in her fingers upon waking, like a limb falling asleep. She is unsure of her arm position during sleep. Symptoms resolve on their own. She denies neck pain, double vision, slurred speech, confusion, or gait problems.      Past Medical History:  Diagnosis Date   Allergy    seasonal   Anxiety    Arthritis    Blood type, Rh negative    Depression    History of chicken pox    PID (pelvic inflammatory disease)    require hospitalizaiton at age 89   Pyloric stenosis    as an infant   Substance abuse (HCC)      Social History[1]  Past  Surgical History:  Procedure Laterality Date   APPENDECTOMY     calf augmentation     CESAREAN SECTION     club foot correction     DILATION AND CURETTAGE OF UTERUS     HERNIA REPAIR     RHINOPLASTY      Family History  Problem Relation Age of Onset   Asthma Mother    Breast cancer Mother 54       survivor   Seizures Mother        in her eye   Hypertension Father    Hyperlipidemia Father    Diabetes Maternal Grandmother    Breast cancer Maternal Grandmother 26   Breast cancer Other        unknown age and pased away   Colon polyps Neg Hx    Colon cancer Neg Hx    Esophageal cancer Neg Hx    Rectal cancer Neg Hx    Stomach cancer Neg Hx     Allergies[2]  Current Medications:  Current Medications[3]   Review of Systems:   Negative unless otherwise specified per HPI.  Vitals:   Vitals:   10/05/24 1103  BP: 130/86  Pulse: 88  Temp: 97.9 F (36.6 C)  TempSrc: Temporal  SpO2: 96%  Weight: 160 lb (72.6 kg)  Height: 5' 4 (1.626 m)  Body mass index is 27.46 kg/m.  Physical Exam:   Physical Exam Vitals and nursing note reviewed.  Constitutional:      General: She is not in acute distress.    Appearance: She is well-developed. She is not ill-appearing or toxic-appearing.  Cardiovascular:     Rate and Rhythm: Normal rate and regular rhythm.     Pulses: Normal pulses.     Heart sounds: Normal heart sounds, S1 normal and S2 normal.  Pulmonary:     Effort: Pulmonary effort is normal.     Breath sounds: Normal breath sounds.  Skin:    General: Skin is warm and dry.  Neurological:     Mental Status: She is alert.     GCS: GCS eye subscore is 4. GCS verbal subscore is 5. GCS motor subscore is 6.  Psychiatric:        Speech: Speech normal.        Behavior: Behavior normal. Behavior is cooperative.     Assessment and Plan:   Assessment and Plan    Overweight Wegovy caused nausea. Zepbound  considered due to lower cost and ease of  self-administration. - Prescribed Zepbound  2.5 mg weekly at lowest dose for self-administration. - Sent prescription to [facility] pharmacy for mail order or local pickup.  Anxious Depression Managed with Lexapro  and lorazepam . Lorazepam  use infrequent. Six-month follow-up required for DEA compliance. - Prescribed 0.5 mg lorazepam  with two-month supply and refill. PDMP reviewed during this encounter. - Continue Lexapro  5 mg daily. - follow up in 6 months, sooner if concerns  Chronic midline low back pain with right-sided sciatica  Symptoms improved with reduced job stress. Occasional pain migrates, possibly due to pinched nerve.  Paresthesia of hands Intermittent tingling likely positional. No TIA or neurological deficits. Blood pressure controlled. - Monitor symptoms and adjust sleeping posture. - If persists, or changes, recommend close follow up and consider additional work-up  General Health Maintenance Flu shot received. Slightly elevated liver enzyme and non-fasting blood sugar. No immediate liver concerns. - Continue routine health maintenance and monitor blood work.         [name], PA-C    [1]  Social History Tobacco Use   Smoking status: Former    Current packs/day: 0.00    Average packs/day: 0.3 packs/day for 11.0 years (2.8 ttl pk-yrs)    Types: Cigarettes    Start date: 74    Quit date: 2006    Years since quitting: 19.9   Smokeless tobacco: Never  Vaping Use   Vaping status: Never Used  Substance Use Topics   Alcohol use: Yes    Comment: no binge drinking; social   Drug use: Not Currently    Types: Other-see comments    Comment: Opiates, on methadone  [2] No Known Allergies [3]  Current Outpatient Medications:    albuterol  (VENTOLIN  HFA) 108 (90 Base) MCG/ACT inhaler, Inhale 2 puffs into the lungs every 4 (four) hours as needed for wheezing or shortness of breath., Disp: 1 each, Rfl: 2   escitalopram  (LEXAPRO ) 5 MG tablet, Take 1 tablet (5 mg  total) by mouth daily., Disp: 90 tablet, Rfl: 0   fluticasone (FLONASE) 50 MCG/ACT nasal spray, Place 1 spray into both nostrils daily., Disp: , Rfl:    ibuprofen  (ADVIL ,MOTRIN ) 200 MG tablet, Take 400 mg by mouth every 6 (six) hours as needed for moderate pain., Disp: , Rfl:    levocetirizine (XYZAL ) 5 MG tablet, Take 1 tablet (5 mg total) by mouth every  evening., Disp: 90 tablet, Rfl: 3   LORazepam  (ATIVAN ) 0.5 MG tablet, Take 1 tablet (0.5 mg total) by mouth 2 (two) times daily as needed for anxiety., Disp: 60 tablet, Rfl: 1   methadone (DOLOPHINE) 10 MG/ML solution, Take 120 mg by mouth daily. [facility], Disp: , Rfl:    montelukast  (SINGULAIR ) 10 MG tablet, Take 1 tablet (10 mg total) by mouth at bedtime., Disp: 90 tablet, Rfl: 3   pramipexole (MIRAPEX) 1 MG tablet, Take 1 mg by mouth daily in the afternoon., Disp: , Rfl:    tirzepatide  (ZEPBOUND ) 2.5 MG/0.5ML injection vial, Inject 2.5 mg into the skin once a week., Disp: 2 mL, Rfl: 2   triamcinolone  cream (KENALOG ) 0.1 %, Apply 1 Application topically 2 (two) times daily., Disp: 30 g, Rfl: 0

## 2024-10-12 ENCOUNTER — Encounter: Payer: Self-pay | Admitting: Physician Assistant

## 2024-10-12 ENCOUNTER — Telehealth: Payer: MEDICAID | Admitting: Physician Assistant

## 2024-10-12 VITALS — Ht 64.0 in | Wt 160.0 lb

## 2024-10-12 DIAGNOSIS — R6889 Other general symptoms and signs: Secondary | ICD-10-CM

## 2024-10-12 MED ORDER — PREDNISONE 20 MG PO TABS: 20.0000 mg | ORAL_TABLET | Freq: Two times a day (BID) | ORAL | 0 refills | Status: AC
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
## 2024-10-26 ENCOUNTER — Encounter: Payer: Self-pay | Admitting: Physician Assistant

## 2024-10-29 ENCOUNTER — Encounter: Payer: Self-pay | Admitting: Physician Assistant

## 2025-04-08 ENCOUNTER — Encounter: Payer: MEDICAID | Admitting: Physician Assistant

## 2025-05-03 ENCOUNTER — Ambulatory Visit: Payer: MEDICAID | Admitting: Dermatology
